# Patient Record
Sex: Male | Born: 1970 | Race: Black or African American | Hispanic: No | Smoking: Current every day smoker
Health system: Southern US, Community
[De-identification: ages and names within clinical notes are randomized; demographics above are authoritative.]

## PROBLEM LIST (undated history)

## (undated) DIAGNOSIS — Z789 Other specified health status: Secondary | ICD-10-CM

## (undated) DIAGNOSIS — I10 Essential (primary) hypertension: Secondary | ICD-10-CM

## (undated) HISTORY — PX: HAND SURGERY: SHX662

---

## 2005-06-16 ENCOUNTER — Emergency Department (HOSPITAL_COMMUNITY): Admission: EM | Admit: 2005-06-16 | Discharge: 2005-06-16 | Payer: Self-pay | Admitting: Emergency Medicine

## 2005-06-20 ENCOUNTER — Emergency Department (HOSPITAL_COMMUNITY): Admission: EM | Admit: 2005-06-20 | Discharge: 2005-06-20 | Payer: Self-pay | Admitting: Family Medicine

## 2008-09-13 ENCOUNTER — Emergency Department (HOSPITAL_COMMUNITY): Admission: EM | Admit: 2008-09-13 | Discharge: 2008-09-13 | Payer: Self-pay | Admitting: Emergency Medicine

## 2008-10-05 ENCOUNTER — Emergency Department (HOSPITAL_COMMUNITY): Admission: EM | Admit: 2008-10-05 | Discharge: 2008-10-05 | Payer: Self-pay | Admitting: Emergency Medicine

## 2008-12-24 ENCOUNTER — Emergency Department (HOSPITAL_COMMUNITY): Admission: EM | Admit: 2008-12-24 | Discharge: 2008-12-24 | Payer: Self-pay | Admitting: Emergency Medicine

## 2009-09-05 ENCOUNTER — Encounter: Admission: RE | Admit: 2009-09-05 | Discharge: 2009-09-05 | Payer: Self-pay | Admitting: *Deleted

## 2010-02-14 ENCOUNTER — Emergency Department (HOSPITAL_COMMUNITY): Admission: EM | Admit: 2010-02-14 | Discharge: 2010-02-14 | Payer: Self-pay | Admitting: Emergency Medicine

## 2010-06-28 ENCOUNTER — Emergency Department (HOSPITAL_COMMUNITY): Admission: EM | Admit: 2010-06-28 | Discharge: 2010-06-28 | Payer: Self-pay | Admitting: Emergency Medicine

## 2011-01-11 ENCOUNTER — Emergency Department (HOSPITAL_COMMUNITY)
Admission: EM | Admit: 2011-01-11 | Discharge: 2011-01-11 | Payer: Self-pay | Source: Home / Self Care | Admitting: Family Medicine

## 2011-01-12 ENCOUNTER — Emergency Department (HOSPITAL_COMMUNITY)
Admission: EM | Admit: 2011-01-12 | Discharge: 2011-01-12 | Payer: Self-pay | Source: Home / Self Care | Admitting: Family Medicine

## 2011-01-13 LAB — DIFFERENTIAL
Basophils Absolute: 0 10*3/uL (ref 0.0–0.1)
Basophils Relative: 0 % (ref 0–1)
Eosinophils Absolute: 0.3 10*3/uL (ref 0.0–0.7)
Eosinophils Relative: 3 % (ref 0–5)
Lymphocytes Relative: 27 % (ref 12–46)
Lymphs Abs: 2.6 10*3/uL (ref 0.7–4.0)
Monocytes Absolute: 0.6 10*3/uL (ref 0.1–1.0)
Monocytes Relative: 6 % (ref 3–12)
Neutro Abs: 6.3 10*3/uL (ref 1.7–7.7)
Neutrophils Relative %: 64 % (ref 43–77)

## 2011-01-13 LAB — STREP A DNA PROBE: Group A Strep Probe: NEGATIVE

## 2011-01-13 LAB — CBC
HCT: 46.6 % (ref 39.0–52.0)
Hemoglobin: 14.7 g/dL (ref 13.0–17.0)
MCH: 27.4 pg (ref 26.0–34.0)
MCHC: 31.5 g/dL (ref 30.0–36.0)
MCV: 86.8 fL (ref 78.0–100.0)
Platelets: 219 10*3/uL (ref 150–400)
RBC: 5.37 MIL/uL (ref 4.22–5.81)
RDW: 13.7 % (ref 11.5–15.5)
WBC: 9.8 10*3/uL (ref 4.0–10.5)

## 2011-01-13 LAB — POCT INFECTIOUS MONO SCREEN: Mono Screen: NEGATIVE

## 2011-01-13 LAB — HIV ANTIBODY (ROUTINE TESTING W REFLEX): HIV: NONREACTIVE

## 2011-01-13 LAB — POCT RAPID STREP A (OFFICE): Streptococcus, Group A Screen (Direct): NEGATIVE

## 2011-03-07 ENCOUNTER — Emergency Department (HOSPITAL_COMMUNITY)
Admission: EM | Admit: 2011-03-07 | Discharge: 2011-03-07 | Disposition: A | Discharge status: Home / Self Care | Payer: 59 | Attending: Emergency Medicine | Admitting: Emergency Medicine

## 2011-03-07 DIAGNOSIS — R059 Cough, unspecified: Secondary | ICD-10-CM | POA: Insufficient documentation

## 2011-03-07 DIAGNOSIS — R05 Cough: Secondary | ICD-10-CM | POA: Insufficient documentation

## 2011-03-07 DIAGNOSIS — J329 Chronic sinusitis, unspecified: Secondary | ICD-10-CM | POA: Insufficient documentation

## 2011-03-07 DIAGNOSIS — R51 Headache: Secondary | ICD-10-CM | POA: Insufficient documentation

## 2011-10-22 ENCOUNTER — Inpatient Hospital Stay (INDEPENDENT_AMBULATORY_CARE_PROVIDER_SITE_OTHER)
Admission: RE | Admit: 2011-10-22 | Discharge: 2011-10-22 | Disposition: A | Discharge status: Home / Self Care | Payer: Self-pay | Source: Ambulatory Visit | Attending: Family Medicine | Admitting: Family Medicine

## 2011-10-22 ENCOUNTER — Ambulatory Visit (INDEPENDENT_AMBULATORY_CARE_PROVIDER_SITE_OTHER): Payer: Self-pay

## 2011-10-22 DIAGNOSIS — S61209A Unspecified open wound of unspecified finger without damage to nail, initial encounter: Secondary | ICD-10-CM

## 2012-01-16 ENCOUNTER — Inpatient Hospital Stay (HOSPITAL_COMMUNITY)
Admission: EM | Admit: 2012-01-16 | Discharge: 2012-01-18 | DRG: 639 | Disposition: A | Discharge status: Home / Self Care | Payer: Self-pay | Attending: Internal Medicine | Admitting: Internal Medicine

## 2012-01-16 ENCOUNTER — Emergency Department (INDEPENDENT_AMBULATORY_CARE_PROVIDER_SITE_OTHER)
Admission: EM | Admit: 2012-01-16 | Discharge: 2012-01-16 | Disposition: A | Discharge status: Other Acute Inpatient Hospital | Payer: Self-pay | Source: Home / Self Care

## 2012-01-16 ENCOUNTER — Encounter (HOSPITAL_COMMUNITY): Payer: Self-pay | Admitting: *Deleted

## 2012-01-16 ENCOUNTER — Emergency Department (HOSPITAL_COMMUNITY): Payer: Self-pay

## 2012-01-16 ENCOUNTER — Encounter (HOSPITAL_COMMUNITY): Payer: Self-pay

## 2012-01-16 DIAGNOSIS — E1110 Type 2 diabetes mellitus with ketoacidosis without coma: Secondary | ICD-10-CM | POA: Diagnosis present

## 2012-01-16 DIAGNOSIS — E1310 Other specified diabetes mellitus with ketoacidosis without coma: Principal | ICD-10-CM | POA: Diagnosis present

## 2012-01-16 DIAGNOSIS — E119 Type 2 diabetes mellitus without complications: Secondary | ICD-10-CM | POA: Diagnosis present

## 2012-01-16 DIAGNOSIS — E860 Dehydration: Secondary | ICD-10-CM | POA: Diagnosis present

## 2012-01-16 DIAGNOSIS — I10 Essential (primary) hypertension: Secondary | ICD-10-CM | POA: Diagnosis present

## 2012-01-16 DIAGNOSIS — F17200 Nicotine dependence, unspecified, uncomplicated: Secondary | ICD-10-CM | POA: Diagnosis present

## 2012-01-16 DIAGNOSIS — R0682 Tachypnea, not elsewhere classified: Secondary | ICD-10-CM | POA: Diagnosis present

## 2012-01-16 DIAGNOSIS — E876 Hypokalemia: Secondary | ICD-10-CM | POA: Diagnosis not present

## 2012-01-16 HISTORY — DX: Other specified health status: Z78.9

## 2012-01-16 LAB — POCT URINALYSIS DIP (DEVICE)
Bilirubin Urine: NEGATIVE
Glucose, UA: 500 mg/dL — AB
Ketones, ur: 40 mg/dL — AB
Leukocytes, UA: NEGATIVE
Nitrite: NEGATIVE
Protein, ur: 100 mg/dL — AB
Specific Gravity, Urine: <=1.005 (ref 1.005–1.030)
Urobilinogen, UA: 0.2 mg/dL (ref 0.0–1.0)
pH: 5 (ref 5.0–8.0)

## 2012-01-16 LAB — CBC
HCT: 54.3 % — ABNORMAL HIGH (ref 39.0–52.0)
HCT: 46 % (ref 39.0–52.0)
Hemoglobin: 17.8 g/dL — ABNORMAL HIGH (ref 13.0–17.0)
MCH: 28 pg (ref 26.0–34.0)
MCHC: 32.8 g/dL (ref 30.0–36.0)
MCV: 85.4 fL (ref 78.0–100.0)
Platelets: 205 10*3/uL (ref 150–400)
RBC: 6.36 MIL/uL — ABNORMAL HIGH (ref 4.22–5.81)
RBC: 5.44 MIL/uL (ref 4.22–5.81)
RDW: 14 % (ref 11.5–15.5)
RDW: 14.1 % (ref 11.5–15.5)
WBC: 10.9 10*3/uL — ABNORMAL HIGH (ref 4.0–10.5)
WBC: 10.3 10*3/uL (ref 4.0–10.5)

## 2012-01-16 LAB — POCT I-STAT 3, VENOUS BLOOD GAS (G3P V)
Bicarbonate: 28.1 mEq/L — ABNORMAL HIGH (ref 20.0–24.0)
pH, Ven: 7.343 — ABNORMAL HIGH (ref 7.250–7.300)

## 2012-01-16 LAB — POCT I-STAT, CHEM 8
BUN: 16 mg/dL (ref 6–23)
Calcium, Ion: 1.33 mmol/L — ABNORMAL HIGH (ref 1.12–1.32)
Chloride: 107 mEq/L (ref 96–112)
Creatinine, Ser: 1 mg/dL (ref 0.50–1.35)
Glucose, Bld: 501 mg/dL — ABNORMAL HIGH (ref 70–99)
HCT: 57 % — ABNORMAL HIGH (ref 39.0–52.0)
Hemoglobin: 19.4 g/dL — ABNORMAL HIGH (ref 13.0–17.0)
Potassium: 4.2 mEq/L (ref 3.5–5.1)
Sodium: 144 mEq/L (ref 135–145)
TCO2: 25 mmol/L (ref 0–100)

## 2012-01-16 LAB — BASIC METABOLIC PANEL
BUN: 16 mg/dL (ref 6–23)
BUN: 15 mg/dL (ref 6–23)
CO2: 25 mEq/L (ref 19–32)
CO2: 24 mEq/L (ref 19–32)
Calcium: 11.5 mg/dL — ABNORMAL HIGH (ref 8.4–10.5)
Chloride: 100 mEq/L (ref 96–112)
Chloride: 106 mEq/L (ref 96–112)
Creatinine, Ser: 1.09 mg/dL (ref 0.50–1.35)
GFR calc Af Amer: >90 mL/min (ref >90)
GFR calc non Af Amer: 85 mL/min — ABNORMAL LOW (ref >90)
Glucose, Bld: 498 mg/dL — ABNORMAL HIGH (ref 70–99)
Glucose, Bld: 304 mg/dL — ABNORMAL HIGH (ref 70–99)
Potassium: 4.4 mEq/L (ref 3.5–5.1)
Potassium: 4.4 mEq/L (ref 3.5–5.1)
Sodium: 146 mEq/L — ABNORMAL HIGH (ref 135–145)

## 2012-01-16 LAB — GLUCOSE, CAPILLARY
Glucose-Capillary: >600 mg/dL (ref 70–99)
Glucose-Capillary: 488 mg/dL — ABNORMAL HIGH (ref 70–99)
Glucose-Capillary: 332 mg/dL — ABNORMAL HIGH (ref 70–99)
Glucose-Capillary: 474 mg/dL — ABNORMAL HIGH (ref 70–99)
Glucose-Capillary: 465 mg/dL — ABNORMAL HIGH (ref 70–99)

## 2012-01-16 LAB — URINALYSIS, ROUTINE W REFLEX MICROSCOPIC
Glucose, UA: >1000 mg/dL — AB
Ketones, ur: 40 mg/dL — AB
Protein, ur: 100 mg/dL — AB

## 2012-01-16 LAB — KETONES, QUALITATIVE

## 2012-01-16 LAB — DIFFERENTIAL
Basophils Absolute: 0 10*3/uL (ref 0.0–0.1)
Basophils Relative: 0 % (ref 0–1)
Eosinophils Absolute: 0 10*3/uL (ref 0.0–0.7)
Eosinophils Relative: 0 % (ref 0–5)
Lymphocytes Relative: 17 % (ref 12–46)
Lymphs Abs: 1.8 10*3/uL (ref 0.7–4.0)
Monocytes Absolute: 0.7 10*3/uL (ref 0.1–1.0)
Monocytes Relative: 7 % (ref 3–12)
Neutro Abs: 8.3 10*3/uL — ABNORMAL HIGH (ref 1.7–7.7)
Neutrophils Relative %: 76 % (ref 43–77)

## 2012-01-16 LAB — POCT I-STAT 3, ART BLOOD GAS (G3+)
O2 Saturation: 96 %
pCO2 arterial: 41.9 mmHg (ref 35.0–45.0)
pO2, Arterial: 89 mmHg (ref 80.0–100.0)

## 2012-01-16 LAB — URINE MICROSCOPIC-ADD ON

## 2012-01-16 MED ORDER — SODIUM CHLORIDE 0.9 % IV SOLN
INTRAVENOUS | Status: AC
  Administered 2012-01-16: 19:00:00 via INTRAVENOUS
  Filled 2012-01-16 (×2): qty 1

## 2012-01-16 MED ORDER — ENOXAPARIN SODIUM 40 MG/0.4ML ~~LOC~~ SOLN: 40.0000 mg | SUBCUTANEOUS | Status: DC

## 2012-01-16 MED ORDER — SODIUM CHLORIDE 0.9 % IV SOLN
INTRAVENOUS | Status: DC
  Administered 2012-01-16: 18:00:00 via INTRAVENOUS

## 2012-01-16 MED ORDER — SODIUM CHLORIDE 0.9 % IV SOLN: INTRAVENOUS | Status: DC

## 2012-01-16 MED ORDER — INSULIN ASPART 100 UNIT/ML ~~LOC~~ SOLN
5.0000 [IU] | Freq: Once | SUBCUTANEOUS | Status: AC
  Administered 2012-01-16: 5 [IU] via INTRAVENOUS
  Filled 2012-01-16: qty 1

## 2012-01-16 MED ORDER — DEXTROSE-NACL 5-0.45 % IV SOLN
INTRAVENOUS | Status: DC
  Administered 2012-01-16: 23:00:00 via INTRAVENOUS

## 2012-01-16 MED ORDER — POTASSIUM CHLORIDE 10 MEQ/100ML IV SOLN
10.0000 meq | INTRAVENOUS | Status: AC
  Administered 2012-01-16 (×2): 10 meq via INTRAVENOUS
  Filled 2012-01-16 (×2): qty 100

## 2012-01-16 MED ORDER — SODIUM CHLORIDE 0.9 % IV SOLN
INTRAVENOUS | Status: AC
  Administered 2012-01-16: 23:00:00 via INTRAVENOUS

## 2012-01-16 MED ORDER — SODIUM CHLORIDE 0.9 % IV SOLN
INTRAVENOUS | Status: DC
  Filled 2012-01-16 (×2): qty 1

## 2012-01-16 MED ORDER — DEXTROSE 50 % IV SOLN: 25.0000 mL | INTRAVENOUS | Status: DC | PRN

## 2012-01-16 MED ORDER — DEXTROSE 50 % IV SOLN
25.0000 mL | INTRAVENOUS | Status: DC | PRN
  Administered 2012-01-17: 13 mL via INTRAVENOUS
  Filled 2012-01-16: qty 50

## 2012-01-16 MED ORDER — DEXTROSE-NACL 5-0.45 % IV SOLN: INTRAVENOUS | Status: DC

## 2012-01-16 MED ORDER — SODIUM CHLORIDE 0.9 % IV BOLUS (SEPSIS)
1000.0000 mL | Freq: Once | INTRAVENOUS | Status: AC
  Administered 2012-01-16: 1000 mL via INTRAVENOUS

## 2012-01-16 MED ORDER — ACETAMINOPHEN 325 MG PO TABS: 650.0000 mg | ORAL_TABLET | Freq: Four times a day (QID) | ORAL | Status: DC | PRN

## 2012-01-16 MED ORDER — INSULIN REGULAR HUMAN 100 UNIT/ML IJ SOLN: 5.0000 [IU] | Freq: Once | INTRAMUSCULAR | Status: DC

## 2012-01-16 MED ORDER — ONDANSETRON HCL 4 MG/2ML IJ SOLN
4.0000 mg | Freq: Once | INTRAMUSCULAR | Status: AC
  Administered 2012-01-16: 4 mg via INTRAVENOUS
  Filled 2012-01-16: qty 2

## 2012-01-16 MED ORDER — ENOXAPARIN SODIUM 40 MG/0.4ML ~~LOC~~ SOLN
40.0000 mg | SUBCUTANEOUS | Status: DC
  Administered 2012-01-16 – 2012-01-17 (×2): 40 mg via SUBCUTANEOUS
  Filled 2012-01-16 (×3): qty 0.4

## 2012-01-16 MED ORDER — INSULIN REGULAR BOLUS VIA INFUSION
0.0000 [IU] | Freq: Three times a day (TID) | INTRAVENOUS | Status: DC
  Filled 2012-01-16 (×4): qty 10

## 2012-01-16 NOTE — ED Notes (Signed)
accucheck 382 mg/dl

## 2012-01-16 NOTE — ED Notes (Signed)
Parish.Fredrickson

## 2012-01-16 NOTE — ED Notes (Signed)
Pt c/o extreme dry mouth, has lost 40lbs in 2 months, fatique for 2 months.

## 2012-01-16 NOTE — H&P (Signed)
PCP:  Sheila Oats, MD, MD   DOA:  01/16/2012  2:00 PM  Chief Complaint:  Polyuria , polydipsia, unintentional weight loss and dryness of mouth for 2 months  HPI:  41 year old African American male with no known past medical history presented to the urgent care with polyuria, polydipsia, nocturia, dry mouth, subjective weight loss of about 40 pounds and  feeling fatigued for last 2 months. He was noted to have a blood glucose of about 600 and sent to [facility]. Denies any history for diabetes mellitus. He denies any nausea or vomiting, fever , denies any abdominal pain headache or blurring of vision, however did feel cramps in his forearms for last few weeks.  In the ED he was again noted to have FSG greater than 500, with an anion gap of 21, positive urinary and serum ketones. He was given 1 L normal saline bolus and 5 units of IV regular insulin and planned for admit to hospitalist service.   Allergies: Allergies  Allergen Reactions  . Penicillins Nausea And Vomiting  . Shellfish Allergy     Prior to Admission medications   Not on File    History reviewed. No pertinent past medical history.  History reviewed. No pertinent past surgical history.     Social History:  reports that he has been smoking a pack per day for 15 years. He reports that he drinks alcohol occasionally . He reports that he does not use illicit drugs.  Family History  Problem Relation Age of Onset  . Diabetes Mother and father     Review of Systems:  Constitutional: appetite change and fatigue with weight loss of about 40 lbs. Denies fever, chills, diaphoresis,  HEENT: c/o dry mouth , Denies photophobia, eye pain, redness, hearing loss, ear pain, congestion, sore throat, rhinorrhea, sneezing, mouth sores, trouble swallowing, neck pain, neck stiffness and tinnitus.   Respiratory: Denies SOB, DOE, cough, chest tightness,  and wheezing.   Cardiovascular: Denies chest pain, palpitations and leg  swelling.  Gastrointestinal: Denies nausea, vomiting, abdominal pain, diarrhea, constipation, blood in stool and abdominal distention.  Genitourinary: Denies dysuria, urgency, frequency, hematuria, flank pain and difficulty urinating.  Musculoskeletal: Denies myalgias, back pain, joint swelling, arthralgias and gait problem.  Skin: Denies pallor, rash and wound.  Neurological: Denies dizziness, seizures, syncope, weakness, light-headedness, numbness and headaches.  Hematological: Denies adenopathy. Easy bruising, personal or family bleeding history  Psychiatric/Behavioral: Denies suicidal ideation, mood changes, confusion, nervousness, sleep disturbance and agitation   Physical Exam:  Filed Vitals:   01/16/12 1401 01/16/12 1607 01/16/12 1739  BP: 152/101 128/86 131/74  Pulse: 96  72  Temp: 98.2 F (36.8 C)    TempSrc: Oral    Resp: 20  16  SpO2: 96%  100%    Constitutional: Vital signs reviewed.  Patient is a well-developed and well-nourished in no acute distress and cooperative with exam. Alert and oriented x3.  HEENT: no pallor, no icterus, dry oral mucosa Cardiovascular: RRR, S1 normal, S2 normal, no MRG, pulses symmetric and intact bilaterally Pulmonary/Chest: CTAB, no wheezes, rales, or rhonchi Abdominal: Soft. Non-tender, non-distended, bowel sounds are normal, no masses, organomegaly, or guarding present.  GU: no CVA tenderness Musculoskeletal: No joint deformities, erythema, or stiffness, ROM full and no nontender Ext: no edema and no cyanosis, pulses palpable bilaterally (DP and PT) Hematology: no cervical, inginal, or axillary adenopathy.  Neurological: A&O x3, Strenght is normal and symmetric bilaterally, cranial nerve II-XII are grossly intact, no focal motor deficit, sensory intact  to light touch bilaterally.  Skin: Warm, dry and intact. No rash, cyanosis, or clubbing.  Psychiatric: Normal mood and affect. speech and behavior is normal. Judgment and thought content  normal. Cognition and memory are normal.   Labs on Admission:  Results for orders placed during the hospital encounter of 01/16/12 (from the past 48 hour(s))  KETONES, QUALITATIVE     Status: Abnormal   Collection Time   01/16/12  3:50 PM      Component Value Range Comment   Acetone, Bld SMALL (*) NEGATIVE    CBC     Status: Abnormal   Collection Time   01/16/12  3:50 PM      Component Value Range Comment   WBC 10.9 (*) 4.0 - 10.5 (K/uL)    RBC 6.36 (*) 4.22 - 5.81 (MIL/uL)    Hemoglobin 17.8 (*) 13.0 - 17.0 (g/dL)    HCT 78.2 (*) 95.6 - 52.0 (%)    MCV 85.4  78.0 - 100.0 (fL)    MCH 28.0  26.0 - 34.0 (pg)    MCHC 32.8  30.0 - 36.0 (g/dL)    RDW 21.3  08.6 - 57.8 (%)    Platelets 205  150 - 400 (K/uL)   DIFFERENTIAL     Status: Abnormal   Collection Time   01/16/12  3:50 PM      Component Value Range Comment   Neutrophils Relative 76  43 - 77 (%)    Neutro Abs 8.3 (*) 1.7 - 7.7 (K/uL)    Lymphocytes Relative 17  12 - 46 (%)    Lymphs Abs 1.8  0.7 - 4.0 (K/uL)    Monocytes Relative 7  3 - 12 (%)    Monocytes Absolute 0.7  0.1 - 1.0 (K/uL)    Eosinophils Relative 0  0 - 5 (%)    Eosinophils Absolute 0.0  0.0 - 0.7 (K/uL)    Basophils Relative 0  0 - 1 (%)    Basophils Absolute 0.0  0.0 - 0.1 (K/uL)   BASIC METABOLIC PANEL     Status: Abnormal   Collection Time   01/16/12  3:50 PM      Component Value Range Comment   Sodium 146 (*) 135 - 145 (mEq/L)    Potassium 4.4  3.5 - 5.1 (mEq/L)    Chloride 100  96 - 112 (mEq/L)    CO2 25  19 - 32 (mEq/L)    Glucose, Bld 498 (*) 70 - 99 (mg/dL)    BUN 16  6 - 23 (mg/dL)    Creatinine, Ser 4.69  0.50 - 1.35 (mg/dL)    Calcium 62.9 (*) 8.4 - 10.5 (mg/dL)    GFR calc non Af Amer 85 (*) >90 (mL/min)    GFR calc Af Amer >90  >90 (mL/min)   GLUCOSE, CAPILLARY     Status: Abnormal   Collection Time   01/16/12  3:50 PM      Component Value Range Comment   Glucose-Capillary 488 (*) 70 - 99 (mg/dL)    Comment 1 Documented in Chart       URINALYSIS, ROUTINE W REFLEX MICROSCOPIC     Status: Abnormal   Collection Time   01/16/12  4:16 PM      Component Value Range Comment   Color, Urine YELLOW  YELLOW     APPearance CLEAR  CLEAR     Specific Gravity, Urine 1.044 (*) 1.005 - 1.030     pH 5.0  5.0 -  8.0     Glucose, UA >1000 (*) NEGATIVE (mg/dL)    Hgb urine dipstick SMALL (*) NEGATIVE     Bilirubin Urine NEGATIVE  NEGATIVE     Ketones, ur 40 (*) NEGATIVE (mg/dL)    Protein, ur 960 (*) NEGATIVE (mg/dL)    Urobilinogen, UA 0.2  0.0 - 1.0 (mg/dL)    Nitrite NEGATIVE  NEGATIVE     Leukocytes, UA NEGATIVE  NEGATIVE    URINE MICROSCOPIC-ADD ON     Status: Normal   Collection Time   01/16/12  4:16 PM      Component Value Range Comment   WBC, UA 0-2  <3 (WBC/hpf)    RBC / HPF 0-2  <3 (RBC/hpf)   POCT I-STAT 3, BLOOD GAS (G3P V)     Status: Abnormal   Collection Time   01/16/12  4:55 PM      Component Value Range Comment   pH, Ven 7.343 (*) 7.250 - 7.300     pCO2, Ven 51.9 (*) 45.0 - 50.0 (mmHg)    pO2, Ven 21.0 (*) 30.0 - 45.0 (mmHg)    Bicarbonate 28.1 (*) 20.0 - 24.0 (mEq/L)    TCO2 30  0 - 100 (mmol/L)    O2 Saturation 32.0      Acid-Base Excess 1.0  0.0 - 2.0 (mmol/L)    Sample type VENOUS      Comment NOTIFIED PHYSICIAN     POCT I-STAT 3, BLOOD GAS (G3+)     Status: Abnormal   Collection Time   01/16/12  5:00 PM      Component Value Range Comment   pH, Arterial 7.342 (*) 7.350 - 7.450     pCO2 arterial 41.9  35.0 - 45.0 (mmHg)    pO2, Arterial 89.0  80.0 - 100.0 (mmHg)    Bicarbonate 22.7  20.0 - 24.0 (mEq/L)    TCO2 24  0 - 100 (mmol/L)    O2 Saturation 96.0      Acid-base deficit 3.0 (*) 0.0 - 2.0 (mmol/L)    Patient temperature 98.6 F      Collection site RADIAL, ALLEN'S TEST ACCEPTABLE      Drawn by Operator      Sample type ARTERIAL     GLUCOSE, CAPILLARY     Status: Abnormal   Collection Time   01/16/12  5:35 PM      Component Value Range Comment   Glucose-Capillary 332 (*) 70 - 99 (mg/dL)      Radiological Exams on Admission: CXR wnl  Assessment 41 year old [place] American male with no known past medical history presented to the urgent care with polyuria, polydipsia, nocturia, dry mouth, subjective weight loss of about 40 pounds and  feeling fatigued for last 2 months. He was noted to have a blood glucose of about 600 and sent to [facility].  Plan  #1 new onset diabetes mellitus with ketoacidosis Patient was given on 1 L normal saline bolus in the ED with 5 units regular insulin Admit  to  step down and start glucose stabilizer protocol with aggressive IV NS @250  CC/HR for hydration and insulin drip with q2h BMP monitoring of anion gap Start D5 half NS once FSG less than 50 and start subcutaneous Lantus insulin once anion gap closed. Check beta OH butarate, lipid panel and hemoglobin A1c monitor and Keep potassium  above 4  Consult diabetic educator Patient counseled on dietary and lifestyle modifications given his new onset DM . Also counseled on smoking  cessation.  Diet: diabetic  DVT prophylaxis   Code status full code Time Spent on Admission: 45 minutes  Deanthony Maull 01/16/2012, 7:22 PM

## 2012-01-16 NOTE — ED Provider Notes (Signed)
Medical screening examination/treatment/procedure(s) were performed by non-physician practitioner and as supervising physician I was immediately available for consultation/collaboration.   Barkley Bruns MD.    Barkley Bruns, MD 01/16/12 [phone]

## 2012-01-16 NOTE — ED Notes (Signed)
Abnormal labs given to MD 

## 2012-01-16 NOTE — ED Notes (Signed)
Pharmacy aware of insulin drip order - will tube it to Pod B when mixed

## 2012-01-16 NOTE — ED Provider Notes (Signed)
History     CSN: [id number]  Arrival date & time 01/16/12  1202   None     Chief Complaint  Patient presents with  . Weight Loss    weight loss, dry mouth    (Consider location/radiation/quality/duration/timing/severity/associated sxs/prior treatment) HPI Comments: Patient presents today with complaints of fatigue, increased thirs,t frequent urination and weight loss for the last 6-8 weeks. He states he's lost 40 pounds in the last 2 months. He has a strong family history of diabetes. He has not had a physician for years and has not had his blood sugar checked previously. He denies headaches, dizziness, chest discomfort, or dyspnea. He has tried everything to quench his thirst - has been sucking on hard candies, drinking juice, water, milk and anything he can.    History reviewed. No pertinent past medical history.  History reviewed. No pertinent past surgical history.  Family History  Problem Relation Age of Onset  . Diabetes Mother     History  Substance Use Topics  . Smoking status: Current Everyday Smoker  . Smokeless tobacco: Not on file  . Alcohol Use: Yes      Review of Systems  Constitutional: Positive for fatigue and unexpected weight change. Negative for fever and chills.  Respiratory: Negative for cough and shortness of breath.   Cardiovascular: Negative for chest pain and palpitations.  Gastrointestinal: Negative for nausea, vomiting and abdominal pain.  Neurological: Negative for dizziness and headaches.    Allergies  Penicillins and Shellfish allergy  Home Medications  No current outpatient prescriptions on file.  BP 135/84  Pulse 86  Temp(Src) 98.7 F (37.1 C) (Oral)  Resp 20  SpO2 98%  Physical Exam  Nursing note and vitals reviewed. Constitutional: He appears well-developed and well-nourished. No distress.  HENT:  Head: Normocephalic and atraumatic.  Right Ear: Tympanic membrane, external ear and ear canal normal.  Left Ear: Tympanic  membrane, external ear and ear canal normal.  Nose: Nose normal.  Mouth/Throat: Uvula is midline, oropharynx is clear and moist and mucous membranes are normal. No oropharyngeal exudate, posterior oropharyngeal edema or posterior oropharyngeal erythema.  Neck: Neck supple.  Cardiovascular: Normal rate, regular rhythm and normal heart sounds.   Pulmonary/Chest: Effort normal and breath sounds normal. No respiratory distress.  Abdominal: Soft. Bowel sounds are normal. He exhibits no distension and no mass. There is no tenderness.  Lymphadenopathy:    He has no cervical adenopathy.  Neurological: He is alert.  Skin: Skin is warm and dry.  Psychiatric: He has a normal mood and affect.    ED Course  Procedures (including critical care time)  Labs Reviewed  GLUCOSE, CAPILLARY - Abnormal; Notable for the following:    Glucose-Capillary >600 (*)    All other components within normal limits  POCT I-STAT, CHEM 8 - Abnormal; Notable for the following:    Glucose, Bld 501 (*)    Calcium, Ion 1.33 (*)    Hemoglobin 19.4 (*)    HCT 57.0 (*)    All other components within normal limits  POCT URINALYSIS DIP (DEVICE) - Abnormal; Notable for the following:    Glucose, UA 500 (*)    Ketones, ur 40 (*)    Hgb urine dipstick SMALL (*)    Protein, ur 100 (*)    All other components within normal limits  POCT CBG MONITORING  I-STAT, CHEM 8  POCT URINALYSIS DIPSTICK   No results found.   1. Newly diagnosed diabetes  MDM   Pt transferred to [facility] via shuttle. Discussed with Dr Artis Flock.        Melody Comas, [place] 01/16/12 1350

## 2012-01-16 NOTE — ED Notes (Signed)
Pt sent [facility] from [facility] for further eval of new onset diabetes, cbg was 501. Having recent fatigue, dry mouth and weight loss.

## 2012-01-16 NOTE — ED Notes (Signed)
accucheck 402 mg/dl

## 2012-01-17 LAB — BASIC METABOLIC PANEL
BUN: 13 mg/dL (ref 6–23)
Calcium: 9.5 mg/dL (ref 8.4–10.5)
Chloride: 111 mEq/L (ref 96–112)
Chloride: 108 mEq/L (ref 96–112)
Creatinine, Ser: 0.83 mg/dL (ref 0.50–1.35)
GFR calc Af Amer: >90 mL/min (ref >90)
GFR calc Af Amer: >90 mL/min (ref >90)
GFR calc Af Amer: >90 mL/min (ref >90)
GFR calc non Af Amer: >90 mL/min (ref >90)
GFR calc non Af Amer: >90 mL/min (ref >90)
Potassium: 3.2 mEq/L — ABNORMAL LOW (ref 3.5–5.1)
Potassium: 3.7 mEq/L (ref 3.5–5.1)
Sodium: 147 mEq/L — ABNORMAL HIGH (ref 135–145)
Sodium: 145 mEq/L (ref 135–145)

## 2012-01-17 LAB — LIPID PANEL
Cholesterol: 325 mg/dL — ABNORMAL HIGH (ref 0–200)
HDL: 33 mg/dL — ABNORMAL LOW (ref >39)
Total CHOL/HDL Ratio: 9.8 RATIO
Triglycerides: 750 mg/dL — ABNORMAL HIGH (ref <150)

## 2012-01-17 LAB — GLUCOSE, CAPILLARY
Glucose-Capillary: 210 mg/dL — ABNORMAL HIGH (ref 70–99)
Glucose-Capillary: 113 mg/dL — ABNORMAL HIGH (ref 70–99)
Glucose-Capillary: 118 mg/dL — ABNORMAL HIGH (ref 70–99)
Glucose-Capillary: 68 mg/dL — ABNORMAL LOW (ref 70–99)
Glucose-Capillary: 146 mg/dL — ABNORMAL HIGH (ref 70–99)
Glucose-Capillary: 177 mg/dL — ABNORMAL HIGH (ref 70–99)
Glucose-Capillary: 409 mg/dL — ABNORMAL HIGH (ref 70–99)

## 2012-01-17 LAB — CBC
Hemoglobin: 14.8 g/dL (ref 13.0–17.0)
Platelets: 192 10*3/uL (ref 150–400)
RBC: 5.39 MIL/uL (ref 4.22–5.81)
WBC: 10.5 10*3/uL (ref 4.0–10.5)

## 2012-01-17 MED ORDER — ONDANSETRON HCL 4 MG/2ML IJ SOLN
INTRAMUSCULAR | Status: AC
  Filled 2012-01-17: qty 2

## 2012-01-17 MED ORDER — INSULIN GLARGINE 100 UNIT/ML ~~LOC~~ SOLN: 15.0000 [IU] | Freq: Every day | SUBCUTANEOUS | Status: DC

## 2012-01-17 MED ORDER — POTASSIUM CHLORIDE 10 MEQ/100ML IV SOLN
INTRAVENOUS | Status: AC
  Administered 2012-01-17: 10 meq via INTRAVENOUS
  Filled 2012-01-17: qty 100

## 2012-01-17 MED ORDER — INSULIN ASPART 100 UNIT/ML ~~LOC~~ SOLN
0.0000 [IU] | SUBCUTANEOUS | Status: DC
  Administered 2012-01-17: 5 [IU] via SUBCUTANEOUS
  Administered 2012-01-17: 8 [IU] via SUBCUTANEOUS
  Administered 2012-01-17: 15 [IU] via SUBCUTANEOUS
  Administered 2012-01-17: 5 [IU] via SUBCUTANEOUS
  Administered 2012-01-17: 15 [IU] via SUBCUTANEOUS
  Administered 2012-01-18: 11 [IU] via SUBCUTANEOUS
  Administered 2012-01-18: 8 [IU] via SUBCUTANEOUS
  Administered 2012-01-18: 3 [IU] via SUBCUTANEOUS
  Administered 2012-01-18: 15 [IU] via SUBCUTANEOUS
  Administered 2012-01-18: 11 [IU] via SUBCUTANEOUS
  Filled 2012-01-17: qty 3

## 2012-01-17 MED ORDER — ONDANSETRON HCL 4 MG/2ML IJ SOLN
4.0000 mg | Freq: Four times a day (QID) | INTRAMUSCULAR | Status: DC | PRN
  Administered 2012-01-17: 4 mg via INTRAVENOUS

## 2012-01-17 MED ORDER — INSULIN GLARGINE 100 UNIT/ML ~~LOC~~ SOLN
25.0000 [IU] | Freq: Every day | SUBCUTANEOUS | Status: DC
  Administered 2012-01-18: 25 [IU] via SUBCUTANEOUS
  Filled 2012-01-17: qty 3

## 2012-01-17 MED ORDER — INSULIN GLARGINE 100 UNIT/ML ~~LOC~~ SOLN
10.0000 [IU] | Freq: Every day | SUBCUTANEOUS | Status: DC
Start: 2012-01-17 — End: 2012-01-17
  Administered 2012-01-17: 10 [IU] via SUBCUTANEOUS
  Filled 2012-01-17: qty 3

## 2012-01-17 MED ORDER — POTASSIUM CHLORIDE 10 MEQ/100ML IV SOLN
10.0000 meq | INTRAVENOUS | Status: AC
  Administered 2012-01-17 (×2): 10 meq via INTRAVENOUS
  Filled 2012-01-17: qty 100

## 2012-01-17 NOTE — Progress Notes (Signed)
At 1630 DM coordinator was notified about this patient having cbg of 409 because wasn't able to get in touch with the rounding physician. She advised me to go ahead and give the correction dose and notified the MD about it. i did as she said.

## 2012-01-17 NOTE — Progress Notes (Signed)
CRITICAL VALUE ALERT  Critical value received:  cbg-68, 13cc D50 given per glucose stabilizer, repeat cbg-118  Date of notification:  01/17/12  Time of notification:  0345  Critical value read back:yes  Nurse who received alert:  Marcellina Millin, RN  MD notified (1st page):  Claiborne Billings, [place]  Time of first page:  0345  MD notified (2nd page):n/a  Time of second page:n/a  Responding MD:  Claiborne Billings, [place]  Time MD responded:  [phone]

## 2012-01-17 NOTE — Progress Notes (Signed)
The patient is ready to transfer to 3000. Vital signs are stable and patient's belongings are with him. Family was updated. Report given to RN on 3000

## 2012-01-17 NOTE — Progress Notes (Signed)
Clinical social worker received referral for other psychosocial needs, however per discussion with pt medical team and m.d. Consult was for diabetic education. CSW reviewed patient chart, and no further csw needs, signing off. Please re consult if needed.   Catha Gosselin, Theresia Majors  [phone] .01/17/2012 16:21pm

## 2012-01-17 NOTE — Progress Notes (Signed)
At 1700 Md was notified about patient's CBG been greater than 400. The action was to increase the lantus  And will be started today instead of tomorrow. Will continue to monitor patient's cbg .

## 2012-01-17 NOTE — Progress Notes (Signed)
Utilization Review Completed.Erik Chung T1/21/2013   

## 2012-01-17 NOTE — Progress Notes (Signed)
Inpatient Diabetes Program Recommendations  AACE/ADA: New Consensus Statement on Inpatient Glycemic Control (2009)  Target Ranges:  Prepandial:   less than 140 mg/dL      Peak postprandial:   less than 180 mg/dL (1-2 hours)      Critically ill patients:  140 - 180 mg/dL   Reason for Visit: RN requesting assistance  Inpatient Diabetes Program Recommendations Insulin - Basal: Pt needs additional Lantus following discontinuation of IV insulin drip.  RN called regarding cbg greater than 400 mg/dL at 1610 today.  Advised her to call MD and get orders for correction dose now and Lantus dose to be given this evening.  Note: Checked latest B-met.  Although cbg is high greater than 400 mg/dL, pt is not presently acidotic.  Will follow-up with patient and MD. Thank you, Lenor Coffin, RN, CNS, Diabetes Coordinator ([phone])     01-17-12 Diabetes Coordinator:

## 2012-01-17 NOTE — Progress Notes (Signed)
CRITICAL VALUE ALERT  Critical value received:  HgbA1C-16.5  Date of notification:  01/17/12  Time of notification:  0050  Critical value read back:yes  Nurse who received alert:  Marcellina Millin, RN  MD notified (1st page):  Claiborne Billings, [place]  Time of first page:  0150  MD notified (2nd page):n/a  Time of second page:n/a  Responding MD:  Claiborne Billings, [place]  Time MD responded:  [phone]

## 2012-01-17 NOTE — ED Provider Notes (Signed)
History     CSN: [id number]  Arrival date & time 01/16/12  1359   First MD Initiated Contact with Patient 01/16/12 1459      Chief Complaint  Patient presents with  . Hyperglycemia    (Consider location/radiation/quality/duration/timing/severity/associated sxs/prior treatment) HPI Patient is a 41 yo male who presents from urgent care for evaluation of hyperglycemia.  aAtient has had 6-8 weeks of increasing fatigue, dry mouth, polyuria, and polydipsia.  He has family history but no personal history of DM.  He does not see a doctor regularly and has no PCP.  He has had a 40 lb weight loss over 2 months with minimal efforts at this.  Patient denies any recent fevers, coughs, or abdominal pain.  Ketonuria was noted on UCC UA.  Patient has no other infectious symptoms including fevers, chills, or night sweats.  He denies any pain but is currently nauseas and very tired.  He is minimally tachycardic but afebrile and mildly hypertensive on presentation.  There are no other associated or modifying factors. Past Medical History  Diagnosis Date  . No pertinent past medical history     History reviewed. No pertinent past surgical history.  Family History  Problem Relation Age of Onset  . Diabetes Mother     History  Substance Use Topics  . Smoking status: Current Everyday Smoker -- 1.0 packs/day    Types: Cigarettes  . Smokeless tobacco: Not on file  . Alcohol Use: 0.6 oz/week    1 Glasses of wine per week      Review of Systems  Constitutional: Positive for appetite change, fatigue and unexpected weight change.  HENT:       Dry mouth  Eyes: Negative.   Respiratory: Negative.   Cardiovascular: Negative.   Gastrointestinal: Positive for nausea and vomiting. Negative for abdominal pain.  Genitourinary: Positive for frequency.  Musculoskeletal: Negative.   Skin: Negative.   Neurological: Negative.   Hematological: Negative.   Psychiatric/Behavioral: Negative.   All other  systems reviewed and are negative.    Allergies  Penicillins and Shellfish allergy  Home Medications  No current outpatient prescriptions on file.  BP 138/64  Pulse 60  Temp(Src) 97.4 F (36.3 C) (Oral)  Resp 10  Ht 5\' 11"  (1.803 m)  Wt 220 lb 0.3 oz (99.8 kg)  BMI 30.69 kg/m2  SpO2 92%  Physical Exam  Nursing note and vitals reviewed. Constitutional: He is oriented to person, place, and time. He appears well-developed and well-nourished. No distress.       Tired and uncomfortable-appearing  HENT:  Head: Normocephalic and atraumatic.  Eyes: Conjunctivae and EOM are normal. Pupils are equal, round, and reactive to light.  Neck: Normal range of motion.  Cardiovascular: Regular rhythm, normal heart sounds and intact distal pulses.  Tachycardia present.  Exam reveals no gallop and no friction rub.   No murmur heard. Pulmonary/Chest: Breath sounds normal. Tachypnea noted. No respiratory distress. He has no wheezes. He has no rales.  Abdominal: Soft. Bowel sounds are normal. He exhibits no distension. There is no tenderness. There is no rebound and no guarding.  Musculoskeletal: Normal range of motion. He exhibits no edema and no tenderness.  Neurological: He is alert and oriented to person, place, and time. No cranial nerve deficit. He exhibits normal muscle tone. Coordination normal.  Skin: Skin is warm and dry. No rash noted.  Psychiatric: He has a normal mood and affect.    ED Course  Procedures (including critical care time)  Labs Reviewed  KETONES, QUALITATIVE - Abnormal; Notable for the following:    Acetone, Bld SMALL (*)    All other components within normal limits  CBC - Abnormal; Notable for the following:    WBC 10.9 (*)    RBC 6.36 (*)    Hemoglobin 17.8 (*)    HCT 54.3 (*)    All other components within normal limits  DIFFERENTIAL - Abnormal; Notable for the following:    Neutro Abs 8.3 (*)    All other components within normal limits  BASIC METABOLIC  PANEL - Abnormal; Notable for the following:    Sodium 146 (*)    Glucose, Bld 498 (*)    Calcium 11.5 (*)    GFR calc non Af Amer 85 (*)    All other components within normal limits  URINALYSIS, ROUTINE W REFLEX MICROSCOPIC - Abnormal; Notable for the following:    Specific Gravity, Urine 1.044 (*)    Glucose, UA >1000 (*)    Hgb urine dipstick SMALL (*)    Ketones, ur 40 (*)    Protein, ur 100 (*)    All other components within normal limits  GLUCOSE, CAPILLARY - Abnormal; Notable for the following:    Glucose-Capillary 488 (*)    All other components within normal limits  POCT I-STAT 3, BLOOD GAS (G3P V) - Abnormal; Notable for the following:    pH, Ven 7.343 (*)    pCO2, Ven 51.9 (*)    pO2, Ven 21.0 (*)    Bicarbonate 28.1 (*)    All other components within normal limits  POCT I-STAT 3, BLOOD GAS (G3+) - Abnormal; Notable for the following:    pH, Arterial 7.342 (*)    Acid-base deficit 3.0 (*)    All other components within normal limits  GLUCOSE, CAPILLARY - Abnormal; Notable for the following:    Glucose-Capillary 332 (*)    All other components within normal limits  HEMOGLOBIN A1C - Abnormal; Notable for the following:    Hemoglobin A1C 16.5 (*)    Mean Plasma Glucose 427 (*)    All other components within normal limits  GLUCOSE, CAPILLARY - Abnormal; Notable for the following:    Glucose-Capillary 474 (*)    All other components within normal limits  GLUCOSE, CAPILLARY - Abnormal; Notable for the following:    Glucose-Capillary 382 (*)    All other components within normal limits  GLUCOSE, CAPILLARY - Abnormal; Notable for the following:    Glucose-Capillary 402 (*)    All other components within normal limits  BASIC METABOLIC PANEL - Abnormal; Notable for the following:    Glucose, Bld 304 (*)    All other components within normal limits  BASIC METABOLIC PANEL - Abnormal; Notable for the following:    Glucose, Bld 182 (*)    All other components within normal  limits  BASIC METABOLIC PANEL - Abnormal; Notable for the following:    Sodium 147 (*)    Potassium 3.2 (*)    Glucose, Bld 103 (*)    All other components within normal limits  BASIC METABOLIC PANEL - Abnormal; Notable for the following:    Glucose, Bld 149 (*)    All other components within normal limits  GLUCOSE, CAPILLARY - Abnormal; Notable for the following:    Glucose-Capillary 465 (*)    All other components within normal limits  LIPID PANEL - Abnormal; Notable for the following:    Cholesterol 325 (*)    Triglycerides 750 (*)  HDL 33 (*)    All other components within normal limits  GLUCOSE, CAPILLARY - Abnormal; Notable for the following:    Glucose-Capillary 275 (*)    All other components within normal limits  GLUCOSE, CAPILLARY - Abnormal; Notable for the following:    Glucose-Capillary 210 (*)    All other components within normal limits  GLUCOSE, CAPILLARY - Abnormal; Notable for the following:    Glucose-Capillary 187 (*)    All other components within normal limits  GLUCOSE, CAPILLARY - Abnormal; Notable for the following:    Glucose-Capillary 158 (*)    All other components within normal limits  URINE MICROSCOPIC-ADD ON  CBC  MRSA PCR SCREENING  CBC  I-STAT 3, BLOOD GAS (G3PV)  POCT CBG MONITORING  POCT CBG MONITORING  POCT CBG MONITORING  BETA-HYDROXYBUTYRIC ACID  POCT CBG MONITORING   Dg Chest 2 View  01/16/2012  *RADIOLOGY REPORT*  Clinical Data: Fatigue  CHEST - 2 VIEW  Comparison: 10/05/2008  Findings: Lungs are clear. No pleural effusion or pneumothorax.  Cardiomediastinal silhouette is within normal limits.  Mild degenerative changes of the visualized thoracolumbar spine.  IMPRESSION: Normal chest radiographs.  Original Report Authenticated By: Charline Bills, M.D.   CRITICAL CARE Performed by: Cyndra Numbers   Total critical care time: 30 minutes  Critical care time was exclusive of separately billable procedures and treating other  patients.  Critical care was necessary to treat or prevent imminent or life-threatening deterioration.  Critical care was time spent personally by me on the following activities: development of treatment plan with patient and/or surrogate as well as nursing, discussions with consultants, evaluation of patient's response to treatment, examination of patient, obtaining history from patient or surrogate, ordering and performing treatments and interventions, ordering and review of laboratory studies, ordering and review of radiographic studies, pulse oximetry and re-evaluation of patient's condition.   1. DKA (diabetic ketoacidoses)       MDM  Patient was evaluated and work-up for possible DKA was initiated.  Urinalysis and CXR showed no infectious cause for symptoms and ketonuria was again noted.  CBC showed minimal leukocytosis of 10.9.  VBG and BMP were ordered.  Initial fingerstick was 488 and NS 1 L IV bolus as well as regular insulin 5 units IV was ordered as patient was insulin naive.  Patient VBG showed pH of 7.34 with a base deficit of 3.  BMP showed a AG of 21.  Glucose stabilizer protocol was initiated.  Patient was admitted as an unassigned patient to the [facility] service to stepdown bed.          Cyndra Numbers, MD 01/17/12 [phone]

## 2012-01-17 NOTE — Progress Notes (Signed)
Patient ID: Erik Chung, male   DOB: 08/03/1971, 41 y.o.   MRN: [id number]  Subjective: No events overnight. Patient denies chest pain, shortness of breath, abdominal pain. Had bowel movement and reports ambulating.  Objective:  Vital signs in last 24 hours:  Filed Vitals:   01/16/12 2112 01/17/12 0000 01/17/12 0400 01/17/12 0800  BP: 154/84 132/82 138/64 136/79  Pulse: 89  60 61  Temp: 97.9 F (36.6 C) 98.2 F (36.8 C) 97.4 F (36.3 C) 98 F (36.7 C)  TempSrc: Oral Oral Oral Oral  Resp: 18  10 16   Height: 5\' 11"  (1.803 m)     Weight: 99.8 kg (220 lb 0.3 oz)     SpO2: 100%  92% 99%    Intake/Output from previous day:   Intake/Output Summary (Last 24 hours) at 01/17/12 0823 Last data filed at 01/17/12 0800  Gross per 24 hour  Intake 1889.7 ml  Output    550 ml  Net 1339.7 ml    Physical Exam: General: Alert, awake, oriented x3, in no acute distress. HEENT: No bruits, no goiter. Moist mucous membranes, no scleral icterus, no conjunctival pallor. Heart: Regular rate and rhythm, S1/S2 +, no murmurs, rubs, gallops. Lungs: Clear to auscultation bilaterally. No wheezing, no rhonchi, no rales.  Abdomen: Soft, nontender, nondistended, positive bowel sounds. Extremities: No clubbing or cyanosis, no pitting edema,  positive pedal pulses. Neuro: Grossly nonfocal.  Lab Results:  Basic Metabolic Panel:    Component Value Date/Time   NA 145 01/17/2012 0357   K 3.7 01/17/2012 0357   CL 108 01/17/2012 0357   CO2 26 01/17/2012 0357   BUN 13 01/17/2012 0357   CREATININE 0.83 01/17/2012 0357   GLUCOSE 149* 01/17/2012 0357   CALCIUM 9.3 01/17/2012 0357   CBC:    Component Value Date/Time   WBC 10.5 01/17/2012 0014   HGB 14.8 01/17/2012 0014   HCT 45.8 01/17/2012 0014   PLT 192 01/17/2012 0014   MCV 85.0 01/17/2012 0014   NEUTROABS 8.3* 01/16/2012 1550   LYMPHSABS 1.8 01/16/2012 1550   MONOABS 0.7 01/16/2012 1550   EOSABS 0.0 01/16/2012 1550   BASOSABS 0.0 01/16/2012 1550      Lab  01/17/12 0014 01/16/12 2207 01/16/12 1550 01/16/12 1307  WBC 10.5 10.3 10.9* --  HGB 14.8 15.1 17.8* 19.4*  HCT 45.8 46.0 54.3* 57.0*  PLT 192 189 205 --  MCV 85.0 84.6 85.4 --  MCH 27.5 27.8 28.0 --  MCHC 32.3 32.8 32.8 --  RDW 14.0 14.1 14.0 --  LYMPHSABS -- -- 1.8 --  MONOABS -- -- 0.7 --  EOSABS -- -- 0.0 --  BASOSABS -- -- 0.0 --  BANDABS -- -- -- --    Lab 01/17/12 0357 01/17/12 0205 01/17/12 0014 01/16/12 2207 01/16/12 1550  NA 145 147* 145 141 146*  K 3.7 3.2* 4.1 4.4 4.4  CL 108 111 109 106 100  CO2 26 26 25 24 25   GLUCOSE 149* 103* 182* 304* 498*  BUN 13 13 14 15 16   CREATININE 0.83 0.78 0.85 0.84 1.09  CALCIUM 9.3 9.2 9.5 9.7 11.5*  MG -- -- -- -- --   No results found for this basename: INR:5,PROTIME:5 in the last 168 hours Cardiac markers: No results found for this basename: CK:3,CKMB:3,TROPONINI:3,MYOGLOBIN:3 in the last 168 hours No components found with this basename: POCBNP:3 Recent Results (from the past 240 hour(s))  MRSA PCR SCREENING     Status: Normal   Collection Time   01/16/12  9:25 PM      Component Value Range Status Comment   MRSA by PCR NEGATIVE  NEGATIVE  Final     Studies/Results: Dg Chest 2 View  01/16/2012  *RADIOLOGY REPORT*  Clinical Data: Fatigue  CHEST - 2 VIEW  Comparison: 10/05/2008  Findings: Lungs are clear. No pleural effusion or pneumothorax.  Cardiomediastinal silhouette is within normal limits.  Mild degenerative changes of the visualized thoracolumbar spine.  IMPRESSION: Normal chest radiographs.  Original Report Authenticated By: Charline Bills, M.D.    Medications: Scheduled Meds:   . enoxaparin  40 mg Subcutaneous Q24H  . insulin aspart  0-15 Units Subcutaneous Q4H  . insulin aspart  5 Units Intravenous Once  . insulin glargine  15 Units Subcutaneous Daily  . insulin (NOVOLIN-R) infusion   Intravenous To Major  . ondansetron  4 mg Intravenous Once  . potassium chloride  10 mEq Intravenous Q1H  . potassium chloride   10 mEq Intravenous Q1 Hr x 2  . sodium chloride  1,000 mL Intravenous Once  . DISCONTD: enoxaparin  40 mg Subcutaneous Q24H  . DISCONTD: insulin glargine  10 Units Subcutaneous Daily  . DISCONTD: insulin regular  5 Units Subcutaneous Once  . DISCONTD: insulin regular  0-10 Units Intravenous TID WC   Continuous Infusions:   . sodium chloride 999 mL/hr at 01/16/12 2233  . DISCONTD: sodium chloride 150 mL/hr at 01/16/12 1819  . DISCONTD: sodium chloride Stopped (01/16/12 2316)  . DISCONTD: dextrose 5 % and 0.45% NaCl    . DISCONTD: dextrose 5 % and 0.45% NaCl 75 mL/hr at 01/16/12 2316  . DISCONTD: insulin (NOVOLIN-R) infusion Stopped (01/17/12 0600)   PRN Meds:.acetaminophen, dextrose, ondansetron, DISCONTD: dextrose  Assessment/Plan:  Principal Problem:  *Diabetes with ketoacidosis - improving, AG closed this AM - pt reports feeling better - transfer to telemetry floor and continue CBG checks as per floor protocol - increase Lantus to 25 units QHS and continue moderate scale SSI for now - follow up on A1C  Active Problems:  Diabetes mellitus, new onset - provide diabetic education    EDUCATION - test results and diagnostic studies were discussed with patient  - patient erbalized the understanding - questions were answered at the bedside and contact information was provided for additional questions or concerns   LOS: 1 day   MAGICK-Skarlett Sedlacek 01/17/2012, 8:23 AM  [facility] Pager: [phone]

## 2012-01-17 NOTE — Progress Notes (Signed)
Inpatient Diabetes Program Recommendations  AACE/ADA: New Consensus Statement on Inpatient Glycemic Control (2009)  Target Ranges:  Prepandial:   less than 140 mg/dL      Peak postprandial:   less than 180 mg/dL (1-2 hours)      Critically ill patients:  140 - 180 mg/dL   Reason for Visit: Consult- New onset diabetes  Inpatient Diabetes Program Recommendations Insulin - Basal: Patient has no insurance. MD may want to consider changing patient from Lantus/Novolog regimen to ReliOn 70/30 from [employer] Outpatient Referral: Request MD order for outpatient diabetes education follow-up  Note: Met with patient and wife earlier this afternoon. Patient very interested in learning about diabetes self-care. To watch Patient Ed [employer] Gave into regarding purchasing a generic meter from [employer]. Question if patient may benefit from switching to 70/30 regimen as this would be more cost-effective.  Perhaps starting dose could be 70/30 17 units BID with breakfast and supper and titrate upwards according to CBG's.  (If he qualifies for [facility] or some other assistance, he could be changed back to a Lantus/Novolog regimen if appropriate.)  Will need to learn to prepare and give insulin with a vial and syringe as ReliOn insulin is only available in a vial.  (Costs approximately  $25/vial) Will request a case management consult to assist with getting a PCP-- possibly from [facility], and facilitate eligibility screening for [employer], etc.  If patient qualifies for [employer], would be able to use it for outpatient diabetes education at the [facility] after discharge.  Will also request nutrition consult.  Patient interested in extreme physical training-- suggested he discuss this with MD.  .

## 2012-01-18 DIAGNOSIS — E876 Hypokalemia: Secondary | ICD-10-CM | POA: Diagnosis not present

## 2012-01-18 DIAGNOSIS — E860 Dehydration: Secondary | ICD-10-CM | POA: Diagnosis present

## 2012-01-18 DIAGNOSIS — I10 Essential (primary) hypertension: Secondary | ICD-10-CM | POA: Diagnosis present

## 2012-01-18 LAB — GLUCOSE, CAPILLARY
Glucose-Capillary: 271 mg/dL — ABNORMAL HIGH (ref 70–99)
Glucose-Capillary: 314 mg/dL — ABNORMAL HIGH (ref 70–99)
Glucose-Capillary: 351 mg/dL — ABNORMAL HIGH (ref 70–99)

## 2012-01-18 LAB — BASIC METABOLIC PANEL
BUN: 12 mg/dL (ref 6–23)
Chloride: 102 mEq/L (ref 96–112)
GFR calc Af Amer: >90 mL/min (ref >90)
Glucose, Bld: 197 mg/dL — ABNORMAL HIGH (ref 70–99)
Potassium: 2.9 mEq/L — ABNORMAL LOW (ref 3.5–5.1)

## 2012-01-18 LAB — POTASSIUM: Potassium: 4.2 mEq/L (ref 3.5–5.1)

## 2012-01-18 LAB — CBC
HCT: 40.5 % (ref 39.0–52.0)
Hemoglobin: 13.2 g/dL (ref 13.0–17.0)
WBC: 7.9 10*3/uL (ref 4.0–10.5)

## 2012-01-18 MED ORDER — BD GETTING STARTED TAKE HOME KIT: 3/10ML X 30G SYRINGES
1.0000 | Freq: Once | Status: AC
  Administered 2012-01-18: 1
  Filled 2012-01-18: qty 1

## 2012-01-18 MED ORDER — INSULIN ASPART 100 UNIT/ML ~~LOC~~ SOLN: 0.0000 [IU] | Freq: Three times a day (TID) | SUBCUTANEOUS | Status: DC

## 2012-01-18 MED ORDER — INSULIN GLARGINE 100 UNIT/ML ~~LOC~~ SOLN: 30.0000 [IU] | Freq: Every day | SUBCUTANEOUS | Status: DC

## 2012-01-18 MED ORDER — LISINOPRIL 10 MG PO TABS: 5.0000 mg | ORAL_TABLET | Freq: Every day | ORAL | Status: DC

## 2012-01-18 MED ORDER — INSULIN NPH ISOPHANE & REGULAR (70-30) 100 UNIT/ML ~~LOC~~ SUSP: 30.0000 [IU] | Freq: Every day | SUBCUTANEOUS | Status: DC

## 2012-01-18 MED ORDER — INSULIN NPH ISOPHANE & REGULAR (70-30) 100 UNIT/ML ~~LOC~~ SUSP: 25.0000 [IU] | Freq: Every day | SUBCUTANEOUS | Status: DC

## 2012-01-18 MED ORDER — POTASSIUM CHLORIDE CRYS ER 20 MEQ PO TBCR
60.0000 meq | EXTENDED_RELEASE_TABLET | Freq: Once | ORAL | Status: AC
  Administered 2012-01-18: 60 meq via ORAL
  Filled 2012-01-18: qty 3

## 2012-01-18 MED ORDER — INSULIN ASPART 100 UNIT/ML ~~LOC~~ SOLN: 0.0000 [IU] | SUBCUTANEOUS | Status: DC

## 2012-01-18 MED ORDER — INSULIN NPH ISOPHANE & REGULAR (70-30) 100 UNIT/ML ~~LOC~~ SUSP: 20.0000 [IU] | Freq: Every day | SUBCUTANEOUS | Status: DC

## 2012-01-18 MED ORDER — INSULIN GLARGINE 100 UNIT/ML ~~LOC~~ SOLN: 25.0000 [IU] | Freq: Every day | SUBCUTANEOUS | Status: DC

## 2012-01-18 NOTE — Progress Notes (Addendum)
Follow-up visit from yesterday.  Patient has watched all Pt Ed Videos regarding diabetes.  Able to recall/discuss content of videos.  Ready to begin giving own insulin.  Nurse to begin insulin administration instruction.   Patient continues to be engaged during my visit and actively trying to problem-solve regarding incorporating diabetes into lifestyle. (Please see yesterday's note regarding possibly discharging patient with prescription for ReliOn 70/30 insulin from [employer] as a more cost-effective way to manage his diabetes.) If patient is changed to 70/30 regimen, request that MD select vial instead of insulin pen for patient so he can give insulin with vial and syringe while here.  Glucose pattern suggests that patient will likely need 70/30 25 units twice daily initially instead of previous recommendation.  Has already received 37 units of correction thus far today.  If patient not changed to 70/30 insulin, will need meal coverage- at least 4 to 6 units Novolog tid with meals.  Won't need meal coverage if changed to 70/30 regimen.

## 2012-01-18 NOTE — Progress Notes (Signed)
Patient was d/c this evening after his potassium was stable. He was educated on  The insulin administration at home , where and when to give it. And the need to check his blood sugar when he doesn't feel well. His assessments remained stable and unchanged prior to discharge.

## 2012-01-18 NOTE — Progress Notes (Signed)
[facility] eligibility appt arranged for 02/04/12, appt with Dr. Georganna Skeans 02/27/12.  Referral to [facility] for community f/u.  Pt to attend classes @ [facility].

## 2012-01-18 NOTE — Discharge Instructions (Signed)
Eligibility appt @ [facility] Friday, 02/04/12 @ 9:30 a.m.  Appt with Dr. Georganna Skeans Wednesday, 03/08/12 @ 9:30 a.m.Blood Sugar Monitoring, Adult GLUCOSE METERS FOR SELF-MONITORING OF BLOOD GLUCOSE  It is important to be able to correctly measure your blood sugar (glucose). You can use a blood glucose monitor (a small battery-operated device) to check your glucose level at any time. This allows you and your caregiver to monitor your diabetes and to determine how well your treatment plan is working. The process of monitoring your blood glucose with a glucose meter is called self-monitoring of blood glucose (SMBG). When people with diabetes control their blood sugar, they have better health. To test for glucose with a typical glucose meter, place the disposable strip in the meter. Then place a small sample of blood on the "test strip." The test strip is coated with chemicals that combine with glucose in blood. The meter measures how much glucose is present. The meter displays the glucose level as a number. Several new models can record and store a number of test results. Some models can connect to personal computers to store test results or print them out.  Newer meters are often easier to use than older models. Some meters allow you to get blood from places other than your fingertip. Some new models have automatic timing, error codes, signals, or barcode readers to help with proper adjustment (calibration). Some meters have a large display screen or spoken instructions for people with visual impairments.  INSTRUCTIONS FOR USING GLUCOSE METERS  Wash your hands with soap and warm water, or clean the area with alcohol. Dry your hands completely.   Prick the side of your fingertip with a lancet (a sharp-pointed tool used by hand).   Hold the hand down and gently milk the finger until a small drop of blood appears. Catch the blood with the test strip.   Follow the instructions for inserting the test  strip and using the SMBG meter. Most meters require the meter to be turned on and the test strip to be inserted before applying the blood sample.   Record the test result.   Read the instructions carefully for both the meter and the test strips that go with it. Meter instructions are found in the user manual. Keep this manual to help you solve any problems that may arise. Many meters use "error codes" when there is a problem with the meter, the test strip, or the blood sample on the strip. You will need the manual to understand these error codes and fix the problem.   New devices are available such as laser lancets and meters that can test blood taken from "alternative sites" of the body, other than fingertips. However, you should use standard fingertip testing if your glucose changes rapidly. Also, use standard testing if:   You have eaten, exercised, or taken insulin in the past 2 hours.   You think your glucose is low.   You tend to not feel symptoms of low blood glucose (hypoglycemia).   You are ill or under stress.   Clean the meter as directed by the manufacturer.   Test the meter for accuracy as directed by the manufacturer.   Take your meter with you to your caregiver's office. This way, you can test your glucose in front of your caregiver to make sure you are using the meter correctly. Your caregiver can also take a sample of blood to test using a routine lab method. If values on the  glucose meter are close to the lab results, you and your caregiver will see that your meter is working well and you are using good technique. Your caregiver will advise you about what to do if the results do not match.  FREQUENCY OF TESTING  Your caregiver will tell you how often you should check your blood glucose. This will depend on your type of diabetes, your current level of diabetes control, and your types of medicines. The following are general guidelines, but your care plan may be different. Record  all your readings and the time of day you took them for review with your caregiver.   Diabetes type 1.   When you are using insulin with good diabetic control (either multiple daily injections or via a pump), you should check your glucose 4 times a day.   If your diabetes is not well controlled, you may need to monitor more frequently, including before meals and 2 hours after meals, at bedtime, and occasionally between 2 a.m. and 3 a.m.   You should always check your glucose before a dose of insulin or before changing the rate on your insulin pump.   Diabetes type 2.   Guidelines for SMBG in diabetes type 2 are not as well defined.   If you are on insulin, follow the guidelines above.   If you are on medicines, but not insulin, and your glucose is not well controlled, you should test at least twice daily.   If you are not on insulin, and your diabetes is controlled with medicines or diet alone, you should test at least once daily, usually before breakfast.   A weekly profile will help your caregiver advise you on your care plan. The week before your visit, check your glucose before a meal and 2 hours after a meal at least daily. You may want to test before and after a different meal each day so you and your caregiver can tell how well controlled your blood sugars are throughout the course of a 24 hour period.   Gestational diabetes (diabetes during pregnancy).   Frequent testing is often necessary. Accurate timing is important.   If you are not on insulin, check your glucose 4 times a day. Check it before breakfast and 1 hour after the start of each meal.   If you are on insulin, check your glucose 6 times a day. Check it before each meal and 1 hour after the first bite of each meal.   General guidelines.   More frequent testing is required at the start of insulin treatment. Your caregiver will instruct you.   Test your glucose any time you suspect you have low blood sugar  (hypoglycemia).   You should test more often when you change medicines, when you have unusual stress or illness, or in other unusual circumstances.  OTHER THINGS TO KNOW ABOUT GLUCOSE METERS  Measurement Range. Most glucose meters are able to read glucose levels over a broad range of values from as low as 0 to as high as 600 mg/dL. If you get an extremely high or low reading from your meter, you should first confirm it with another reading. Report very high or very low readings to your caregiver.   Whole Blood Glucose versus Plasma Glucose. Some older home glucose meters measure glucose in your whole blood. In a lab or when using some newer home glucose meters, the glucose is measured in your plasma (one component of blood). The difference can be important. It is  important for you and your caregiver to know whether your meter gives its results as "whole blood equivalent" or "plasma equivalent."   Display of High and Low Glucose Values. Part of learning how to operate a meter is understanding what the meter results mean. Know how high and low glucose concentrations are displayed on your meter.   Factors that Affect Glucose Meter Performance. The accuracy of your test results depends on many factors and varies depending on the brand and type of meter. These factors include:   Low red blood cell count (anemia).   Substances in your blood (such as uric acid, vitamin C, and others).   Environmental factors (temperature, humidity, altitude).   Name-brand versus generic test strips.   Calibration. Make sure your meter is set up properly. It is a good idea to do a calibration test with a control solution recommended by the manufacturer of your meter whenever you begin using a fresh bottle of test strips. This will help verify the accuracy of your meter.   Improperly stored, expired, or defective test strips. Keep your strips in a dry place with the lid on.   Soiled meter.   Inadequate blood  sample.  NEW TECHNOLOGIES FOR GLUCOSE TESTING Alternative site testing Some glucose meters allow testing blood from alternative sites. These include the:  Upper arm.   Forearm.   Base of the thumb.   Thigh.  Sampling blood from alternative sites may be desirable. However, it may have some limitations. Blood in the fingertips show changes in glucose levels more quickly than blood in other parts of the body. This means that alternative site test results may be different from fingertip test results, not because of the meter's ability to test accurately, but because the actual glucose concentration can be different.  Continuous Glucose Monitoring Devices to measure your blood glucose continuously are available, and others are in development. These methods can be more expensive than self-monitoring with a glucose meter. However, it is uncertain how effective and reliable these devices are. Your caregiver will advise you if this approach makes sense for you. IF BLOOD SUGARS ARE CONTROLLED, PEOPLE WITH DIABETES REMAIN HEALTHIER.  SMBG is an important part of the treatment plan of patients with diabetes mellitus. Below are reasons for using SMBG:   It confirms that your glucose is at a specific, healthy level.   It detects hypoglycemia and severe hyperglycemia.   It allows you and your caregiver to make adjustments in response to changes in lifestyle for individuals requiring medicine.   It determines the need for starting insulin therapy in temporary diabetes that happens during pregnancy (gestational diabetes).  Document Released: 12/16/2003 Document Revised: 08/26/2011 Document Reviewed: 04/08/2011 [facility] Patient Information 2012 [place], [place].

## 2012-01-18 NOTE — Discharge Summary (Signed)
DISCHARGE SUMMARY  Erik Chung  MR#: [id number]  DOB:01-26-1971  Date of Admission: 01/16/2012 Date of Discharge: 01/18/2012  Attending Physician:Audrey Thull  Patient's PCP: None- [facility]  Consults: none  Presenting Complaint: Polyuria, polydipsia, unintentional weight loss  Discharge Diagnoses: Diabetes with ketoacidosis Diabetes mellitus, new onset HTN Dehydration     Discharge Medications: Medication List  As of 01/18/2012 12:23 PM   TAKE these medications         insulin NPH-insulin regular (70-30) 100 UNIT/ML injection   Commonly known as: NOVOLIN 70/30   Inject 20 Units into the skin daily with breakfast.      lisinopril 10 MG tablet   Commonly known as: PRINIVIL,ZESTRIL   Take 0.5 tablets (5 mg total) by mouth daily.             Procedures: Dg Chest 2 View  01/16/2012  *RADIOLOGY REPORT*  Clinical Data: Fatigue  CHEST - 2 VIEW  Comparison: 10/05/2008  Findings: Lungs are clear. No pleural effusion or pneumothorax.  Cardiomediastinal silhouette is within normal limits.  Mild degenerative changes of the visualized thoracolumbar spine.  IMPRESSION: Normal chest radiographs.  Original Report Authenticated By: Charline Bills, M.D.    Hospital Course:   *Diabetes with ketoacidosis/ Diabetes mellitus, new onset This is a 41 y/o male who presented with the above mentioned complaints including a 40 lb weight loss. He was treated for DKA with an Glucose stablizer, aggressive hydration and potassium replacement. He has been transtioned to Lantus and Novolog but will not be able to afford these and therefore, once his pens have finished, he will start 70/30. He has received education regarding this new diagnosis and diet changes. He will also f/u with the outpt diabetes teaching program and be starting an exercise program to help him continue to lose weight.   HTN-  BP was noted to be high on admission and has continued to be mildly elevated.  I have started a very low dose Lisinopril and ask him to watch his salt intake.   Dehydration  Was due to DKA and now resolved.   Hypokalemia Potassium replaced and improved to 4.2.    Day of Discharge Physical Exam: BP 121/72  Pulse 74  Temp(Src) 97.6 F (36.4 C) (Oral)  Resp 18  Ht 5\' 11"  (1.803 m)  Wt 99.8 kg (220 lb 0.3 oz)  BMI 30.69 kg/m2  SpO2 96%  General: Alert, awake, oriented x3, in no acute distress.  HEENT: No bruits, no goiter. Moist mucous membranes, no scleral icterus, no conjunctival pallor.  Heart: Regular rate and rhythm, S1/S2 +, no murmurs, rubs, gallops.  Lungs: Clear to auscultation bilaterally. No wheezing, no rhonchi, no rales.  Abdomen: Soft, nontender, nondistended, positive bowel sounds.  Extremities: No clubbing or cyanosis, no pitting edema, positive pedal pulses.  Neuro: Grossly nonfocal   Results for orders placed during the hospital encounter of 01/16/12 (from the past 24 hour(s))  GLUCOSE, CAPILLARY     Status: Abnormal   Collection Time   01/17/12  4:19 PM      Component Value Range   Glucose-Capillary 409 (*) 70 - 99 (mg/dL)  GLUCOSE, CAPILLARY     Status: Abnormal   Collection Time   01/17/12  6:01 PM      Component Value Range   Glucose-Capillary 417 (*) 70 - 99 (mg/dL)  GLUCOSE, CAPILLARY     Status: Abnormal   Collection Time   01/17/12  8:07 PM      Component  Value Range   Glucose-Capillary 260 (*) 70 - 99 (mg/dL)  GLUCOSE, CAPILLARY     Status: Abnormal   Collection Time   01/18/12 12:09 AM      Component Value Range   Glucose-Capillary 351 (*) 70 - 99 (mg/dL)  GLUCOSE, CAPILLARY     Status: Abnormal   Collection Time   01/18/12  3:46 AM      Component Value Range   Glucose-Capillary 271 (*) 70 - 99 (mg/dL)  CBC     Status: Normal   Collection Time   01/18/12  6:15 AM      Component Value Range   WBC 7.9  4.0 - 10.5 (K/uL)   RBC 4.84  4.22 - 5.81 (MIL/uL)   Hemoglobin 13.2  13.0 - 17.0 (g/dL)   HCT 19.1  47.8 - 29.5 (%)    MCV 83.7  78.0 - 100.0 (fL)   MCH 27.3  26.0 - 34.0 (pg)   MCHC 32.6  30.0 - 36.0 (g/dL)   RDW 62.1  30.8 - 65.7 (%)   Platelets 161  150 - 400 (K/uL)  BASIC METABOLIC PANEL     Status: Abnormal   Collection Time   01/18/12  6:15 AM      Component Value Range   Sodium 138  135 - 145 (mEq/L)   Potassium 2.9 (*) 3.5 - 5.1 (mEq/L)   Chloride 102  96 - 112 (mEq/L)   CO2 27  19 - 32 (mEq/L)   Glucose, Bld 197 (*) 70 - 99 (mg/dL)   BUN 12  6 - 23 (mg/dL)   Creatinine, Ser 8.46  0.50 - 1.35 (mg/dL)   Calcium 8.8  8.4 - 96.2 (mg/dL)   GFR calc non Af Amer >90  >90 (mL/min)   GFR calc Af Amer >90  >90 (mL/min)  GLUCOSE, CAPILLARY     Status: Abnormal   Collection Time   01/18/12  7:45 AM      Component Value Range   Glucose-Capillary 191 (*) 70 - 99 (mg/dL)  GLUCOSE, CAPILLARY     Status: Abnormal   Collection Time   01/18/12 11:57 AM      Component Value Range   Glucose-Capillary 314 (*) 70 - 99 (mg/dL)    Disposition: stable   Follow-up Appts: Discharge Orders    Future Orders Please Complete By Expires   Diet - low sodium heart healthy      Comments:   And moderate carb controlled   Increase activity slowly      Discharge instructions      Comments:   1. Try to eat breakfast and dinner 12 hrs apart and at the same time each day. Its good to eat 5-6 small meals rather than 2-3 big meals.  2. Give the 70/30 insulin 30 min prior to meal.  3. Keep a piece of quickly dissolving candy with you at all times. Tablets specific for this are available as well at the pharmacy.  4. If you feel like your sugar is low, make sure to check it first before eating anything.  5. Keep a log of what time you eat, what you eat and how much insulin you gave yourself.        Follow-up with [facility] -eligibility appt- 02/04/12 -appt with physician Georganna Skeans, MD 02/27/12   Time on Discharge: >45 min  Signed: Calvert Cantor 01/18/2012, 12:23 PM

## 2012-01-18 NOTE — Plan of Care (Addendum)
Problem: Food- and Nutrition-Related Knowledge Deficit (NB-1.1) Goal: Nutrition education Formal process to instruct or train a patient/client in a skill or to impart knowledge to help patients/clients voluntarily manage or modify food choices and eating behavior to maintain or improve health.  Outcome: Completed/Met Date Met:  01/18/12 RD consult for DM diet education. Reviewed guidelines and recommendations. Handouts provided from [employer]. Pt currently on a Carbohydrate Medium Calorie diet with 75-100% PO intake. Pt triggered for unintentional weight loss per nutrition screen however confirmed this was in fact intentional as he was actively trying to lose weight. BMI = 30.6 kg/m2 (Obesity Class I). Expect good compliance. No further nutrition intervention indicated at this time.  Alger Memos, RD, LDN Pager # [phone]

## 2012-01-21 LAB — BETA-HYDROXYBUTYRIC ACID: Beta-Hydroxybutyric Acid: 4 mmol/L — ABNORMAL HIGH

## 2012-08-31 ENCOUNTER — Encounter (HOSPITAL_COMMUNITY): Payer: Self-pay | Admitting: Emergency Medicine

## 2012-08-31 ENCOUNTER — Emergency Department (HOSPITAL_COMMUNITY)
Admission: EM | Admit: 2012-08-31 | Discharge: 2012-08-31 | Disposition: A | Discharge status: Home / Self Care | Payer: Self-pay | Attending: Emergency Medicine | Admitting: Emergency Medicine

## 2012-08-31 DIAGNOSIS — E119 Type 2 diabetes mellitus without complications: Secondary | ICD-10-CM | POA: Insufficient documentation

## 2012-08-31 DIAGNOSIS — Z91013 Allergy to seafood: Secondary | ICD-10-CM | POA: Insufficient documentation

## 2012-08-31 DIAGNOSIS — I10 Essential (primary) hypertension: Secondary | ICD-10-CM | POA: Insufficient documentation

## 2012-08-31 DIAGNOSIS — K0889 Other specified disorders of teeth and supporting structures: Secondary | ICD-10-CM

## 2012-08-31 DIAGNOSIS — Z794 Long term (current) use of insulin: Secondary | ICD-10-CM | POA: Insufficient documentation

## 2012-08-31 DIAGNOSIS — Z880 Allergy status to penicillin: Secondary | ICD-10-CM | POA: Insufficient documentation

## 2012-08-31 DIAGNOSIS — F17200 Nicotine dependence, unspecified, uncomplicated: Secondary | ICD-10-CM | POA: Insufficient documentation

## 2012-08-31 DIAGNOSIS — K089 Disorder of teeth and supporting structures, unspecified: Secondary | ICD-10-CM | POA: Insufficient documentation

## 2012-08-31 HISTORY — DX: Essential (primary) hypertension: I10

## 2012-08-31 MED ORDER — HYDROCODONE-ACETAMINOPHEN 5-325 MG PO TABS: 1.0000 | ORAL_TABLET | ORAL | Status: AC | PRN

## 2012-08-31 MED ORDER — IBUPROFEN 800 MG PO TABS: 800.0000 mg | ORAL_TABLET | Freq: Three times a day (TID) | ORAL | Status: AC | PRN

## 2012-08-31 MED ORDER — CLINDAMYCIN HCL 300 MG PO CAPS: 300.0000 mg | ORAL_CAPSULE | Freq: Four times a day (QID) | ORAL | Status: AC

## 2012-08-31 NOTE — ED Provider Notes (Signed)
History     CSN: [id number]  Arrival date & time 08/31/12  0159   First MD Initiated Contact with Patient 08/31/12 [phone]      Chief Complaint  Patient presents with  . Dental Pain    (Consider location/radiation/quality/duration/timing/severity/associated sxs/prior treatment) HPI Comments: Patient reports he has had three days of left upper molar pain.  Pain is constant and "serious" but eases up occasionally.  Has been taking tylenol and ibuprofen without relief.  Has a known cavity in this region.  Pt has also had a cough x 2 weeks that is productive but without SOB or CP.  No fevers.  No difficulty swallowing or breathing.  Was a [facility] Patient.  Does not have a dentist.    Patient is a 41 y.o. male presenting with tooth pain. The history is provided by the patient.  Dental PainThe primary symptoms include cough. Primary symptoms do not include fever, shortness of breath or sore throat.  Additional symptoms do not include: trouble swallowing.    Past Medical History  Diagnosis Date  . No pertinent past medical history   . Diabetes mellitus   . Hypertension     History reviewed. No pertinent past surgical history.  Family History  Problem Relation Age of Onset  . Diabetes Mother     History  Substance Use Topics  . Smoking status: Current Everyday Smoker -- 1.0 packs/day    Types: Cigarettes  . Smokeless tobacco: Not on file  . Alcohol Use: 0.6 oz/week    1 Glasses of wine per week      Review of Systems  Constitutional: Negative for fever and chills.  HENT: Positive for dental problem. Negative for sore throat and trouble swallowing.   Respiratory: Positive for cough. Negative for shortness of breath.   Cardiovascular: Negative for chest pain.    Allergies  Penicillins and Shellfish allergy  Home Medications   Current Outpatient Rx  Name Route Sig Dispense Refill  . ACETAMINOPHEN 500 MG PO TABS Oral Take 1,000 mg by mouth every 6 (six) hours as  needed. Dental pain    . IBUPROFEN 200 MG PO TABS Oral Take 800 mg by mouth every 6 (six) hours as needed. Pain    . INSULIN ISOPHANE & REGULAR (70-30) 100 UNIT/ML [place] SUSP Subcutaneous Inject 20-30 Units into the skin daily with breakfast. 30 units in the morning and 20 units at night    . LISINOPRIL 10 MG PO TABS Oral Take 10 mg by mouth daily.    Marland Kitchen METFORMIN HCL 500 MG PO TABS Oral Take 500 mg by mouth 2 (two) times daily with a meal.    . CLINDAMYCIN HCL 300 MG PO CAPS Oral Take 1 capsule (300 mg total) by mouth 4 (four) times daily. 40 capsule 0  . HYDROCODONE-ACETAMINOPHEN 5-325 MG PO TABS Oral Take 1 tablet by mouth every 4 (four) hours as needed for pain. 15 tablet 0  . IBUPROFEN 800 MG PO TABS Oral Take 1 tablet (800 mg total) by mouth every 8 (eight) hours as needed for pain. 21 tablet 0    BP 164/94  Pulse 71  Temp 98 F (36.7 C) (Oral)  SpO2 94%  Physical Exam  Nursing note and vitals reviewed. Constitutional: He appears well-developed and well-nourished. No distress.  HENT:  Head: Normocephalic and atraumatic.  Mouth/Throat: Uvula is midline and oropharynx is clear and moist. Mucous membranes are not dry. No uvula swelling. No oropharyngeal exudate, posterior oropharyngeal edema or posterior  oropharyngeal erythema.         Left upper third molar tender to percussion. No gingival swelling or tenderness.  No facial swelling.    Neck: Normal range of motion. Neck supple. No tracheal tenderness present. No tracheal deviation present.  Cardiovascular: Normal rate and regular rhythm.   Pulmonary/Chest: Effort normal and breath sounds normal. No stridor. No respiratory distress. He has no wheezes. He has no rales.  Lymphadenopathy:    He has no cervical adenopathy.  Neurological: He is alert.  Skin: He is not diaphoretic.    ED Course  Procedures (including critical care time)  Labs Reviewed - No data to display No results found.   1. Pain, dental       MDM    Patient with several days of dental pain.  No fevers, nontoxic.  No obvious abscess.  Doubt ludwig's angina or deeper infection.  Pt allergic to penicillin, have given clindamycin Rx, ibuprofen and norco.  D/C home with dental follow up.  Discussed diagnosis and plan with patient.  Pt given return precautions.  Pt verbalizes understanding and agrees with plan.           [place], [place] 08/31/12 [phone]

## 2012-08-31 NOTE — ED Provider Notes (Signed)
Medical screening examination/treatment/procedure(s) were performed by non-physician practitioner and as supervising physician I was immediately available for consultation/collaboration.  Jerrell Hart M Azion Centrella, MD 08/31/12 2135 

## 2012-08-31 NOTE — ED Notes (Signed)
Pt alert, arrives from home, c/o dental pain, onset several days ago, resp even unlabored, skin pwd

## 2012-08-31 NOTE — ED Notes (Signed)
Pt c/o upper L jaw pain x 3 days, pt states this is due to cavities. Pt states he has not taken anything for pain nor has he been seen by dentist. Pt requesting work note for today.

## 2014-02-24 ENCOUNTER — Inpatient Hospital Stay (HOSPITAL_COMMUNITY)
Admission: EM | Admit: 2014-02-24 | Discharge: 2014-02-26 | DRG: 440 | Disposition: A | Discharge status: Home / Self Care | Payer: 59 | Attending: Internal Medicine | Admitting: Internal Medicine

## 2014-02-24 ENCOUNTER — Encounter (HOSPITAL_COMMUNITY): Payer: Self-pay | Admitting: Emergency Medicine

## 2014-02-24 DIAGNOSIS — R739 Hyperglycemia, unspecified: Secondary | ICD-10-CM

## 2014-02-24 DIAGNOSIS — E119 Type 2 diabetes mellitus without complications: Secondary | ICD-10-CM | POA: Diagnosis present

## 2014-02-24 DIAGNOSIS — F17200 Nicotine dependence, unspecified, uncomplicated: Secondary | ICD-10-CM | POA: Diagnosis present

## 2014-02-24 DIAGNOSIS — E876 Hypokalemia: Secondary | ICD-10-CM

## 2014-02-24 DIAGNOSIS — Z794 Long term (current) use of insulin: Secondary | ICD-10-CM

## 2014-02-24 DIAGNOSIS — K859 Acute pancreatitis without necrosis or infection, unspecified: Principal | ICD-10-CM | POA: Diagnosis present

## 2014-02-24 DIAGNOSIS — E860 Dehydration: Secondary | ICD-10-CM

## 2014-02-24 DIAGNOSIS — I10 Essential (primary) hypertension: Secondary | ICD-10-CM | POA: Diagnosis present

## 2014-02-24 MED ORDER — ACETAMINOPHEN 325 MG PO TABS
650.0000 mg | ORAL_TABLET | Freq: Once | ORAL | Status: AC
  Administered 2014-02-24: 650 mg via ORAL
  Filled 2014-02-24: qty 2

## 2014-02-24 NOTE — ED Notes (Signed)
Pt. reports left abdominal pain onset 2 days ago , denies nausea or vomitting / no diarrhea. denies fever or chills.

## 2014-02-25 ENCOUNTER — Encounter (HOSPITAL_COMMUNITY): Payer: Self-pay | Admitting: *Deleted

## 2014-02-25 ENCOUNTER — Emergency Department (HOSPITAL_COMMUNITY): Payer: 59

## 2014-02-25 DIAGNOSIS — R7309 Other abnormal glucose: Secondary | ICD-10-CM

## 2014-02-25 DIAGNOSIS — I10 Essential (primary) hypertension: Secondary | ICD-10-CM

## 2014-02-25 DIAGNOSIS — E119 Type 2 diabetes mellitus without complications: Secondary | ICD-10-CM

## 2014-02-25 DIAGNOSIS — K859 Acute pancreatitis without necrosis or infection, unspecified: Principal | ICD-10-CM | POA: Diagnosis present

## 2014-02-25 LAB — GLUCOSE, CAPILLARY
GLUCOSE-CAPILLARY: 283 mg/dL — AB (ref 70–99)
GLUCOSE-CAPILLARY: 312 mg/dL — AB (ref 70–99)
Glucose-Capillary: 215 mg/dL — ABNORMAL HIGH (ref 70–99)

## 2014-02-25 LAB — HEPATIC FUNCTION PANEL
ALBUMIN: 3.4 g/dL — AB (ref 3.5–5.2)
ALT: 5 U/L (ref 0–53)
AST: 5 U/L (ref 0–37)
Alkaline Phosphatase: 53 U/L (ref 39–117)
BILIRUBIN DIRECT: 0.2 mg/dL (ref 0.0–0.3)
Indirect Bilirubin: 0.2 mg/dL — ABNORMAL LOW (ref 0.3–0.9)
TOTAL PROTEIN: 0.4 g/dL — AB (ref 6.0–8.3)
Total Bilirubin: 0.4 mg/dL (ref 0.3–1.2)

## 2014-02-25 LAB — CBC WITH DIFFERENTIAL/PLATELET
BASOS ABS: 0 10*3/uL (ref 0.0–0.1)
Basophils Relative: 0 % (ref 0–1)
EOS ABS: 0.1 10*3/uL (ref 0.0–0.7)
Eosinophils Relative: 1 % (ref 0–5)
HCT: 44.1 % (ref 39.0–52.0)
Hemoglobin: 16.3 g/dL (ref 13.0–17.0)
LYMPHS PCT: 17 % (ref 12–46)
Lymphs Abs: 1.7 10*3/uL (ref 0.7–4.0)
MCH: 30.5 pg (ref 26.0–34.0)
MCHC: 37 g/dL — AB (ref 30.0–36.0)
MCV: 82.4 fL (ref 78.0–100.0)
Monocytes Absolute: 0.8 10*3/uL (ref 0.1–1.0)
Monocytes Relative: 8 % (ref 3–12)
NEUTROS ABS: 7.6 10*3/uL (ref 1.7–7.7)
NEUTROS PCT: 74 % (ref 43–77)
Platelets: 283 10*3/uL (ref 150–400)
RBC: 5.35 MIL/uL (ref 4.22–5.81)
RDW: 13.9 % (ref 11.5–15.5)
WBC: 10.2 10*3/uL (ref 4.0–10.5)

## 2014-02-25 LAB — URINALYSIS, ROUTINE W REFLEX MICROSCOPIC
LEUKOCYTES UA: NEGATIVE
Nitrite: NEGATIVE
PROTEIN: 100 mg/dL — AB
Specific Gravity, Urine: 1.043 — ABNORMAL HIGH (ref 1.005–1.030)
Urobilinogen, UA: 0.2 mg/dL (ref 0.0–1.0)
pH: 5.5 (ref 5.0–8.0)

## 2014-02-25 LAB — URINE MICROSCOPIC-ADD ON

## 2014-02-25 LAB — ETHANOL: Alcohol, Ethyl (B): <11 mg/dL (ref 0–11)

## 2014-02-25 LAB — I-STAT CHEM 8, ED
BUN: 7 mg/dL (ref 6–23)
CREATININE: 0.6 mg/dL (ref 0.50–1.35)
Calcium, Ion: 1.15 mmol/L (ref 1.12–1.23)
Chloride: 103 mEq/L (ref 96–112)
Glucose, Bld: 252 mg/dL — ABNORMAL HIGH (ref 70–99)
HEMATOCRIT: 49 % (ref 39.0–52.0)
HEMOGLOBIN: 16.7 g/dL (ref 13.0–17.0)
POTASSIUM: 4.7 meq/L (ref 3.7–5.3)
SODIUM: 133 meq/L — AB (ref 137–147)
TCO2: 18 mmol/L (ref 0–100)

## 2014-02-25 LAB — TRIGLYCERIDES: Triglycerides: 1475 mg/dL — ABNORMAL HIGH (ref <150)

## 2014-02-25 LAB — CBG MONITORING, ED: Glucose-Capillary: 251 mg/dL — ABNORMAL HIGH (ref 70–99)

## 2014-02-25 LAB — LIPASE, BLOOD: LIPASE: 48 U/L (ref 11–59)

## 2014-02-25 MED ORDER — SODIUM CHLORIDE 0.9 % IV BOLUS (SEPSIS)
1000.0000 mL | Freq: Once | INTRAVENOUS | Status: AC
  Administered 2014-02-25: 1000 mL via INTRAVENOUS

## 2014-02-25 MED ORDER — INSULIN ASPART 100 UNIT/ML ~~LOC~~ SOLN
0.0000 [IU] | Freq: Three times a day (TID) | SUBCUTANEOUS | Status: DC
  Administered 2014-02-25: 8 [IU] via SUBCUTANEOUS
  Administered 2014-02-25: 5 [IU] via SUBCUTANEOUS
  Administered 2014-02-26: 8 [IU] via SUBCUTANEOUS

## 2014-02-25 MED ORDER — INSULIN DETEMIR 100 UNIT/ML ~~LOC~~ SOLN
10.0000 [IU] | Freq: Every day | SUBCUTANEOUS | Status: DC
  Administered 2014-02-25 – 2014-02-26 (×2): 10 [IU] via SUBCUTANEOUS
  Filled 2014-02-25 (×2): qty 0.1

## 2014-02-25 MED ORDER — IOHEXOL 300 MG/ML  SOLN
20.0000 mL | Freq: Once | INTRAMUSCULAR | Status: AC | PRN
  Administered 2014-02-25: 20 mL via ORAL

## 2014-02-25 MED ORDER — MORPHINE SULFATE 4 MG/ML IJ SOLN
4.0000 mg | Freq: Once | INTRAMUSCULAR | Status: AC
  Administered 2014-02-25: 4 mg via INTRAVENOUS
  Filled 2014-02-25: qty 1

## 2014-02-25 MED ORDER — HYDROMORPHONE HCL PF 1 MG/ML IJ SOLN
0.5000 mg | INTRAMUSCULAR | Status: DC | PRN
  Administered 2014-02-25 – 2014-02-26 (×3): 0.5 mg via INTRAVENOUS
  Filled 2014-02-25 (×3): qty 1

## 2014-02-25 MED ORDER — INSULIN ASPART 100 UNIT/ML ~~LOC~~ SOLN
0.0000 [IU] | Freq: Every day | SUBCUTANEOUS | Status: DC
  Administered 2014-02-25: 4 [IU] via SUBCUTANEOUS

## 2014-02-25 MED ORDER — ENOXAPARIN SODIUM 40 MG/0.4ML ~~LOC~~ SOLN
40.0000 mg | SUBCUTANEOUS | Status: DC
  Administered 2014-02-25: 40 mg via SUBCUTANEOUS
  Filled 2014-02-25 (×2): qty 0.4

## 2014-02-25 MED ORDER — ONDANSETRON HCL 4 MG/2ML IJ SOLN: 4.0000 mg | Freq: Once | INTRAMUSCULAR | Status: DC

## 2014-02-25 MED ORDER — PANTOPRAZOLE SODIUM 40 MG IV SOLR
40.0000 mg | INTRAVENOUS | Status: DC
  Administered 2014-02-25: 40 mg via INTRAVENOUS
  Filled 2014-02-25 (×2): qty 40

## 2014-02-25 MED ORDER — ONDANSETRON HCL 4 MG/2ML IJ SOLN: 4.0000 mg | Freq: Four times a day (QID) | INTRAMUSCULAR | Status: DC | PRN

## 2014-02-25 MED ORDER — ONDANSETRON HCL 4 MG PO TABS: 4.0000 mg | ORAL_TABLET | Freq: Four times a day (QID) | ORAL | Status: DC | PRN

## 2014-02-25 MED ORDER — SODIUM CHLORIDE 0.9 % IV SOLN
INTRAVENOUS | Status: DC
  Administered 2014-02-25 – 2014-02-26 (×2): via INTRAVENOUS

## 2014-02-25 MED ORDER — HYDROCODONE-ACETAMINOPHEN 5-325 MG PO TABS
1.0000 | ORAL_TABLET | ORAL | Status: DC | PRN
  Administered 2014-02-25: 1 via ORAL
  Filled 2014-02-25: qty 1

## 2014-02-25 MED ORDER — IOHEXOL 300 MG/ML  SOLN
100.0000 mL | Freq: Once | INTRAMUSCULAR | Status: AC | PRN
  Administered 2014-02-25: 100 mL via INTRAVENOUS

## 2014-02-25 NOTE — Discharge Instructions (Signed)
° ° °  Outpatient Metformin Instructions (Glucophage, Glucovance, Fortamet, Riomet, Metaglip, Glumetza, Actoplus met  Avandamet, Janumet)   Patient: Erik Chung                                                02/25/2014:    Radiology Exam:  CT Abd/Pel w/cm   As part of your exam today in the Radiology Department, you were given a radiographic contrast material or x-ray dye.  Because you have had this contrast material and you are taking a Metformin drug (Glucophage, Glucovance, Avandamet, Fortamet, Riomet, Metaglip, Glumetza, Actoplus met, Actoplus Met XR, Prandimet or Janumet), please observe the following instructions:   DO NOT  Take this medication for 48 hours after your exam.  Because you have normal renal function and have no comorbidities, you may restart your medication in 48 hours with no need for a renal function test or consultation with your physician.  You have normal renal function but have some comorbidities.  Comorbidities include liver disease, alcohol overuse, heart failure, myocardial or muscular ischemia, sepsis, or other severe infection.  Therefore you should consult your physician before restarting your medication.  You have impaired renal function.  You should consult your physician before restarting your medication and you are advised to get a renal function test before restarting your medication.  Please discuss this with your physician.   Call your doctor before you start taking this medication again.  Your doctor may want to check your kidney function before you start taking this medication again.  I understand these instructions and have had an opportunity to discuss them with Radiology Department personnel.

## 2014-02-25 NOTE — Progress Notes (Signed)
[facility] PROGRESS NOTE Interim History: 43 y.o. male with Past medical history of diabetes mellitus, hypertension.  The patient is coming from home.  The pt presented with complain of abdominal pain that has been on going since last 3 days. Pain is located in the left abdomen and has been associated with nausea but no vomiting. He woke up with this pain 3 nights ago.  He denies any similar pain before and denies any diarrhea or constipation.   Assessment/Plan: Acute pancreatitis: - Will like to try orals. IV fluids, narcotics for pain. - TG's 1475.   Diabetes mellitus - start levemir and SSI.  HTN (hypertension) -  bp stable.  Code Status: Full  Disposition: Admitted to inpatient in med-surge unit.    Consultants:  none  Procedures:  CT abd  Antibiotics:  none  HPI/Subjective: Pain is unchanged wants to eat. morphine does nothing for pain.    Objective: Filed Vitals:   02/25/14 0600 02/25/14 0642 02/25/14 0715 02/25/14 0745  BP: 127/70 128/66 138/71   Pulse:   100   Temp:    98.7 F (37.1 C)  TempSrc:    Oral  Resp: 20 10 22    SpO2: 95% 98% 94%    No intake or output data in the 24 hours ending 02/25/14 0854 There were no vitals filed for this visit.  Exam:  General: Alert, awake, oriented x3, in no acute distress.  HEENT: No bruits, no goiter.  Heart: Regular rate and rhythm, without murmurs, rubs, gallops.  Lungs: Good air movement, clear  Abdomen: Soft, right flank tenderness, nondistended, positive bowel sounds.  Neuro: Grossly intact, nonfocal.   Data Reviewed: Basic Metabolic Panel:  Recent Labs Lab 02/25/14 0325  NA 133*  K 4.7  CL 103  GLUCOSE 252*  BUN 7  CREATININE 0.60   Liver Function Tests:  Recent Labs Lab 02/25/14 0315  AST 5  ALT 5  ALKPHOS 53  BILITOT 0.4  PROT 0.4*  ALBUMIN 3.4*    Recent Labs Lab 02/25/14 0315  LIPASE 48   No results found for this basename: AMMONIA,  in the last 168  hours CBC:  Recent Labs Lab 02/24/14 2208 02/25/14 0325  WBC 10.2  --   NEUTROABS 7.6  --   HGB 16.3 16.7  HCT 44.1 49.0  MCV 82.4  --   PLT 283  --    Cardiac Enzymes: No results found for this basename: CKTOTAL, CKMB, CKMBINDEX, TROPONINI,  in the last 168 hours BNP (last 3 results) No results found for this basename: PROBNP,  in the last 8760 hours CBG:  Recent Labs Lab 02/25/14 0213  GLUCAP 251*    No results found for this or any previous visit (from the past 240 hour(s)).   Studies: Ct Abdomen Pelvis W Contrast  02/25/2014   CLINICAL DATA:  Abdominal pain.  EXAM: CT ABDOMEN AND PELVIS WITH CONTRAST  TECHNIQUE: Multidetector CT imaging of the abdomen and pelvis was performed using the standard protocol following bolus administration of intravenous contrast.  CONTRAST:  13mL OMNIPAQUE IOHEXOL 300 MG/ML  SOLN  COMPARISON:  None.  FINDINGS: BODY WALL: Small fatty umbilical hernia  LOWER CHEST: Unremarkable.  ABDOMEN/PELVIS:  Liver: Approximately 4 cm mass in the central liver, with peripheral nodular enhancement. No delayed imaging of this lesion to evaluate for centripetal fill-in. No distortion of the neighboring vascular structures. In the absence of chronic liver disease or malignancy, and given similar sized finding on sonography 08/27/2013, this most  consistent with a hemangioma. There are 2 additional, smaller focal lesions, located in segment 6 and in the central right hepatic lobe, measuring 12 mm or smaller. These are indeterminate due to size.  Biliary: No evidence of biliary obstruction or stone.  Pancreas: There is retroperitoneal edema around the mildly expanded tail of the pancreas. The neighboring organs do not appear primarily inflamed. There is no necrosis or fluid collection.  Spleen: Unremarkable.  Adrenals: Unremarkable.  Kidneys and ureters: No hydronephrosis or stone.  Bladder: Unremarkable.  Reproductive: Unremarkable.  Bowel: No obstruction. Normal appendix.   Retroperitoneum: Edema as above.  Peritoneum: No free fluid or gas.  Vascular: Early aortic atherosclerosis  OSSEOUS: No acute abnormalities. Calcification associated with the right S1 nerve root (in the foramen close) , favor post inflammatory.  IMPRESSION: 1. Acute pancreatitis, involving the tail. No necrosis or loculated fluid. 2. 4 cm hepatic mass, appearance most consistent with hemangioma.   Electronically Signed   By: Jorje Guild M.D.   On: 02/25/2014 05:00    Scheduled Meds: . enoxaparin (LOVENOX) injection  40 mg Subcutaneous Q24H  . insulin aspart  0-15 Units Subcutaneous TID WC  . insulin aspart  0-5 Units Subcutaneous QHS  . ondansetron (ZOFRAN) IV  4 mg Intravenous Once  . pantoprazole (PROTONIX) IV  40 mg Intravenous Q24H   Continuous Infusions: . sodium chloride       Charlynne Cousins  [facility] Pager [phone]. If 8PM-8AM, please contact night-coverage at [email or web address], password [facility] 02/25/2014, 8:54 AM  LOS: 1 day

## 2014-02-25 NOTE — ED Provider Notes (Signed)
CSN: [id number]     Arrival date & time 02/24/14  2147 History   First MD Initiated Contact with Patient 02/25/14 0004     Chief Complaint  Patient presents with  . Abdominal Pain      Patient is a 43 y.o. male presenting with abdominal pain. The history is provided by the patient.  Abdominal Pain Pain location:  LLQ Pain quality: aching   Pain radiates to:  Does not radiate Pain severity:  Moderate Onset quality:  Gradual Duration:  2 days Timing:  Constant Progression:  Worsening Chronicity:  New Relieved by:  Nothing Ineffective treatments: palpation. Associated symptoms: constipation   Associated symptoms: no chest pain, no diarrhea, no dysuria, no shortness of breath and no vomiting     Past Medical History  Diagnosis Date  . No pertinent past medical history   . Diabetes mellitus   . Hypertension    History reviewed. No pertinent past surgical history. Family History  Problem Relation Age of Onset  . Diabetes Mother    History  Substance Use Topics  . Smoking status: Current Every Day Smoker -- 1.00 packs/day    Types: Cigarettes  . Smokeless tobacco: Not on file  . Alcohol Use: 0.6 oz/week    1 Glasses of wine per week    Review of Systems  Respiratory: Negative for shortness of breath.   Cardiovascular: Negative for chest pain.  Gastrointestinal: Positive for abdominal pain and constipation. Negative for vomiting and diarrhea.  Genitourinary: Negative for dysuria.  All other systems reviewed and are negative.      Allergies  Shellfish allergy and Penicillins  Home Medications   Current Outpatient Rx  Name  Route  Sig  Dispense  Refill  . insulin aspart protamine- aspart (NOVOLOG MIX 70/30) (70-30) 100 UNIT/ML injection   Subcutaneous   Inject 30-40 Units into the skin See admin instructions. Take 40 units in the morning and 30 units at bedtime         . lisinopril (PRINIVIL,ZESTRIL) 10 MG tablet   Oral   Take 10 mg by mouth daily.          . metFORMIN (GLUCOPHAGE) 500 MG tablet   Oral   Take 500 mg by mouth daily with breakfast.          BP 141/79  Pulse 109  Temp(Src) 101.4 F (38.6 C) (Oral)  Resp 24  SpO2 97% Physical Exam CONSTITUTIONAL: Well developed/well nourished HEAD: Normocephalic/atraumatic EYES: EOMI/PERRL ENMT: Mucous membranes moist NECK: supple no meningeal signs SPINE:entire spine nontender CV: S1/S2 noted, no murmurs/rubs/gallops noted LUNGS: Lungs are clear to auscultation bilaterally, no apparent distress ABDOMEN: soft, moderate LLQ tenderness, no rebound or guarding No suprapubic tenderness GU:no cva tenderness, no scrotal tenderness, no inguinal hernia noted NEURO: Pt is awake/alert, moves all extremitiesx4 EXTREMITIES: pulses normal, full ROM SKIN: warm, color normal PSYCH: no abnormalities of mood noted  ED Course  Procedures  12:49 AM Pt with h/o DM (unsure his recent glucose reading) presents with LLQ pain for 2 days with fever Will need to obtain CT imaging Pt agreeable 3:09 AM Labs continue to hemolyze Pt stable Will give another bolus of fluids and re-order labs 6:39 AM Acute pancreatitis noted by CT Lipase normal Denies excessive ETOH use Will admit for pain control, IV fluids and monitoring    Labs Review Labs Reviewed  CBC WITH DIFFERENTIAL - Abnormal; Notable for the following:    MCHC 37.0 (*)    All other components within  normal limits  URINALYSIS, ROUTINE W REFLEX MICROSCOPIC  COMPREHENSIVE METABOLIC PANEL  LIPASE, BLOOD   Imaging Review Ct Abdomen Pelvis W Contrast  02/25/2014   CLINICAL DATA:  Abdominal pain.  EXAM: CT ABDOMEN AND PELVIS WITH CONTRAST  TECHNIQUE: Multidetector CT imaging of the abdomen and pelvis was performed using the standard protocol following bolus administration of intravenous contrast.  CONTRAST:  177mL OMNIPAQUE IOHEXOL 300 MG/ML  SOLN  COMPARISON:  None.  FINDINGS: BODY WALL: Small fatty umbilical hernia  LOWER CHEST:  Unremarkable.  ABDOMEN/PELVIS:  Liver: Approximately 4 cm mass in the central liver, with peripheral nodular enhancement. No delayed imaging of this lesion to evaluate for centripetal fill-in. No distortion of the neighboring vascular structures. In the absence of chronic liver disease or malignancy, and given similar sized finding on sonography 08/27/2013, this most consistent with a hemangioma. There are 2 additional, smaller focal lesions, located in segment 6 and in the central right hepatic lobe, measuring 12 mm or smaller. These are indeterminate due to size.  Biliary: No evidence of biliary obstruction or stone.  Pancreas: There is retroperitoneal edema around the mildly expanded tail of the pancreas. The neighboring organs do not appear primarily inflamed. There is no necrosis or fluid collection.  Spleen: Unremarkable.  Adrenals: Unremarkable.  Kidneys and ureters: No hydronephrosis or stone.  Bladder: Unremarkable.  Reproductive: Unremarkable.  Bowel: No obstruction. Normal appendix.  Retroperitoneum: Edema as above.  Peritoneum: No free fluid or gas.  Vascular: Early aortic atherosclerosis  OSSEOUS: No acute abnormalities. Calcification associated with the right S1 nerve root (in the foramen close) , favor post inflammatory.  IMPRESSION: 1. Acute pancreatitis, involving the tail. No necrosis or loculated fluid. 2. 4 cm hepatic mass, appearance most consistent with hemangioma.   Electronically Signed   By: Jorje Guild M.D.   On: 02/25/2014 05:00     MDM   Final diagnoses:  Acute pancreatitis  Hyperglycemia    Nursing notes including past medical history and social history reviewed and considered in documentation Labs/vital reviewed and considered     Sharyon Cable, MD 02/25/14 [phone]

## 2014-02-25 NOTE — H&P (Signed)
[facility] History and Physical  Patient: Erik Chung  VVO:[id number]  DOB: [birth year]  DOS: the patient was seen and examined on 02/25/2014 PCP: Default, Provider, MD  Chief Complaint: abdominal pain  HPI: Erik Chung is a 43 y.o. male with Past medical history of diabetes mellitus, hypertension. The patient is coming from home. The pt presented with complain of abdominal pain that has been on going since last 3 days. Pain is located in the left abdomen and has been associated with nausea but no vomiting. He woke up with this pain 3 nights ago.  He denies any similar pain before and denies any diarrhea or constipation.  Pt denies any fever, chills, headache, cough, chest pain, palpitation, shortness of breath, orthopnea, PND, active bleeding, burning urination, dizziness, pedal edema,  focal neurological deficit.  He not compliant with his insulin and but claims he has been taking his lisinopril and metformin.  Review of Systems: as mentioned in the history of present illness.  A Comprehensive review of the other systems is negative.  Past Medical History  Diagnosis Date  . No pertinent past medical history   . Diabetes mellitus   . Hypertension    History reviewed. No pertinent past surgical history. Social History:  reports that he has been smoking Cigarettes.  He has been smoking about 1.00 pack per day. He does not have any smokeless tobacco history on file. He reports that he drinks about 0.6 ounces of alcohol per week. He reports that he does not use illicit drugs. Independent for most of his  ADL.  Allergies  Allergen Reactions  . Shellfish Allergy Anaphylaxis  . Penicillins Nausea And Vomiting    Family History  Problem Relation Age of Onset  . Diabetes Mother     Prior to Admission medications   Medication Sig Start Date End Date Taking? Authorizing Provider  insulin aspart protamine- aspart (NOVOLOG MIX 70/30) (70-30) 100 UNIT/ML injection Inject 30-40  Units into the skin See admin instructions. Take 40 units in the morning and 30 units at bedtime   Yes Historical Provider, MD  lisinopril (PRINIVIL,ZESTRIL) 10 MG tablet Take 10 mg by mouth daily.   Yes Historical Provider, MD  metFORMIN (GLUCOPHAGE) 500 MG tablet Take 500 mg by mouth daily with breakfast.   Yes Historical Provider, MD    Physical Exam: Filed Vitals:   02/25/14 0344 02/25/14 0500 02/25/14 0515 02/25/14 0600  BP: 112/62 128/70 127/62 127/70  Pulse:  97 101   Temp:      TempSrc:      Resp: 17 28 16 20   SpO2: 97% 97% 93% 95%    General: Alert, Awake and Oriented to Time, Place and Person. Appear in moderate distress Eyes: PERRL ENT: Oral Mucosa clear moist. Neck: no JVD Cardiovascular: S1 and S2 Present, no Murmur, Peripheral Pulses Present Respiratory: Bilateral Air entry equal and Decreased, Clear to Auscultation,  no Crackles,no wheezes Abdomen: Bowel Sound Present, Soft and left lower quadrant tenderness, voluntry guarding but not regidity Skin: no Rash Extremities: no Pedal edema, no calf tenderness Neurologic: Grossly Unremarkable.  Labs on Admission:  CBC:  Recent Labs Lab 02/24/14 2208 02/25/14 0325  WBC 10.2  --   NEUTROABS 7.6  --   HGB 16.3 16.7  HCT 44.1 49.0  MCV 82.4  --   PLT 283  --     CMP     Component Value Date/Time   NA 133* 02/25/2014 0325   K 4.7 02/25/2014 0325   CL  103 02/25/2014 0325   CO2 27 01/18/2012 0615   GLUCOSE 252* 02/25/2014 0325   BUN 7 02/25/2014 0325   CREATININE 0.60 02/25/2014 0325   CALCIUM 8.8 01/18/2012 0615   GFRNONAA >90 01/18/2012 0615   GFRAA >90 01/18/2012 0615     Recent Labs Lab 02/25/14 0315  LIPASE 48   No results found for this basename: AMMONIA,  in the last 168 hours  No results found for this basename: CKTOTAL, CKMB, CKMBINDEX, TROPONINI,  in the last 168 hours BNP (last 3 results) No results found for this basename: PROBNP,  in the last 8760 hours  Radiological Exams on Admission: Ct Abdomen  Pelvis W Contrast  02/25/2014   CLINICAL DATA:  Abdominal pain.  EXAM: CT ABDOMEN AND PELVIS WITH CONTRAST  TECHNIQUE: Multidetector CT imaging of the abdomen and pelvis was performed using the standard protocol following bolus administration of intravenous contrast.  CONTRAST:  124mL OMNIPAQUE IOHEXOL 300 MG/ML  SOLN  COMPARISON:  None.  FINDINGS: BODY WALL: Small fatty umbilical hernia  LOWER CHEST: Unremarkable.  ABDOMEN/PELVIS:  Liver: Approximately 4 cm mass in the central liver, with peripheral nodular enhancement. No delayed imaging of this lesion to evaluate for centripetal fill-in. No distortion of the neighboring vascular structures. In the absence of chronic liver disease or malignancy, and given similar sized finding on sonography 08/27/2013, this most consistent with a hemangioma. There are 2 additional, smaller focal lesions, located in segment 6 and in the central right hepatic lobe, measuring 12 mm or smaller. These are indeterminate due to size.  Biliary: No evidence of biliary obstruction or stone.  Pancreas: There is retroperitoneal edema around the mildly expanded tail of the pancreas. The neighboring organs do not appear primarily inflamed. There is no necrosis or fluid collection.  Spleen: Unremarkable.  Adrenals: Unremarkable.  Kidneys and ureters: No hydronephrosis or stone.  Bladder: Unremarkable.  Reproductive: Unremarkable.  Bowel: No obstruction. Normal appendix.  Retroperitoneum: Edema as above.  Peritoneum: No free fluid or gas.  Vascular: Early aortic atherosclerosis  OSSEOUS: No acute abnormalities. Calcification associated with the right S1 nerve root (in the foramen close) , favor post inflammatory.  IMPRESSION: 1. Acute pancreatitis, involving the tail. No necrosis or loculated fluid. 2. 4 cm hepatic mass, appearance most consistent with hemangioma.   Electronically Signed   By: Jorje Guild M.D.   On: 02/25/2014 05:00    Assessment/Plan Principal Problem:   Acute  pancreatitis Active Problems:   HTN (hypertension)   Diabetes mellitus   1. Acute pancreatitis CT abdomen suggest acute pancreatitis of tail, no gallstone or biliary stricture. He denies any alcohol abuse. We'll check alcohol level, triglyceride level NPO, IV fluids, IV Zofran IV Protonix IV Dilaudid as needed for pain  2. Diabetes mellitus Sliding scale holding metformin and insulin  3.Hypertension Blood pressure stable, holding lisinopril   DVT Prophylaxis: subcutaneous Heparin Nutrition: N.p.o.  Code Status: Full  Disposition: Admitted to inpatient in med-surge unit.  Author: Berle Mull, MD [facility] Pager: [phone] 02/25/2014, 6:08 AM    If 7PM-7AM, please contact night-coverage [email or web address] Password [email or web address]

## 2014-02-25 NOTE — ED Notes (Signed)
CBG is 251. Pt states that this is a good level for him, and that it is usually higher.

## 2014-02-26 DIAGNOSIS — I10 Essential (primary) hypertension: Secondary | ICD-10-CM

## 2014-02-26 DIAGNOSIS — E119 Type 2 diabetes mellitus without complications: Secondary | ICD-10-CM

## 2014-02-26 DIAGNOSIS — K859 Acute pancreatitis without necrosis or infection, unspecified: Principal | ICD-10-CM

## 2014-02-26 LAB — GLUCOSE, CAPILLARY: GLUCOSE-CAPILLARY: 271 mg/dL — AB (ref 70–99)

## 2014-02-26 MED ORDER — GEMFIBROZIL 600 MG PO TABS: 600.0000 mg | ORAL_TABLET | Freq: Two times a day (BID) | ORAL | Status: DC

## 2014-02-26 MED ORDER — LISINOPRIL 10 MG PO TABS: 10.0000 mg | ORAL_TABLET | Freq: Every day | ORAL | Status: DC

## 2014-02-26 MED ORDER — HYDROCODONE-ACETAMINOPHEN 5-325 MG PO TABS
1.0000 | ORAL_TABLET | ORAL | Status: DC | PRN
Start: 2014-02-26 — End: 2020-11-21

## 2014-02-26 MED ORDER — METFORMIN HCL 500 MG PO TABS: 1000.0000 mg | ORAL_TABLET | Freq: Two times a day (BID) | ORAL | Status: DC

## 2014-02-26 NOTE — Care Management Note (Addendum)
    Page 1 of 1   02/26/2014     5:11:05 PM   CARE MANAGEMENT NOTE 02/26/2014  Patient:  Erik Chung, Erik Chung   Account Number:  [id number]  Date Initiated:  02/26/2014  Documentation initiated by:  Tomi Bamberger  Subjective/Objective Assessment:   dx acute pancreatitis  admit- lives with wife.     Action/Plan:   Anticipated DC Date:  02/26/2014   Anticipated DC Plan:  [place]  CM consult      Choice offered to / List presented to:             Status of service:  Completed, signed off Medicare Important Message given?   (If response is "NO", the following Medicare IM given date fields will be blank) Date Medicare IM given:   Date Additional Medicare IM given:    Discharge Disposition:  HOME/SELF CARE  Per UR Regulation:  Reviewed for med. necessity/level of care/duration of stay  If discussed at [place] of Stay Meetings, dates discussed:    Comments:  02/26/14 [place], BSN [phone] patient for dc today, NCM gave patient a list of accepting physicians , to call to schedule an apt.

## 2014-02-26 NOTE — Progress Notes (Signed)
Utilization review completed.  

## 2014-02-26 NOTE — Discharge Summary (Signed)
Physician Discharge Summary  Erik Chung TIW:[id number] DOB: 1971-12-02 DOA: 02/24/2014  PCP: Default, Provider, MD  Admit date: 02/24/2014 Discharge date: 02/26/2014  Time spent: 34 minutes  Recommendations for Outpatient Follow-up:  1. Follow up with PCP in 6 weeks.  Discharge Diagnoses:  Principal Problem:   Acute pancreatitis Active Problems:   HTN (hypertension)   Diabetes mellitus   Discharge Condition: stable  Diet recommendation: carb modified  Filed Weights   02/26/14 0539  Weight: 95.4 kg (210 lb 5.1 oz)    History of present illness:  43 y.o. male with Past medical history of diabetes mellitus, hypertension.  The patient is coming from home.  The pt presented with complain of abdominal pain that has been on going since last 3 days. Pain is located in the left abdomen and has been associated with nausea but no vomiting. He woke up with this pain 3 nights ago.  He denies any similar pain before and denies any diarrhea or constipation.  Pt denies any fever, chills, headache, cough, chest pain, palpitation, shortness of breath, orthopnea, PND, active bleeding, burning urination, dizziness, pedal edema, focal neurological deficit.    Hospital Course:  Acute pancreatitis:  - started IV fluids, narcotics for pain.  - tolerated diet. - TG's 1475. Started gemfibrosil.  Diabetes mellitus  - start levemir and SSI.  - change to 70/30  HTN (hypertension)  - bp stable. - cont lisinopril  Procedures:  CT abd  Consultations:  noen  Discharge Exam: Filed Vitals:   02/26/14 0539  BP: 129/73  Pulse: 85  Temp: 98.4 F (36.9 C)  Resp: 18    General: A&O x3 Cardiovascular: RRR Respiratory: good air movement CTA B/L  Discharge Instructions      Discharge Orders   Future Orders Complete By Expires   Diet - low sodium heart healthy  As directed    Increase activity slowly  As directed        Medication List         gemfibrozil 600 MG tablet   Commonly known as:  LOPID  Take 1 tablet (600 mg total) by mouth 2 (two) times daily before a meal.     insulin aspart protamine- aspart (70-30) 100 UNIT/ML injection  Commonly known as:  NOVOLOG MIX 70/30  Inject 30-40 Units into the skin See admin instructions. Take 40 units in the morning and 30 units at bedtime     lisinopril 10 MG tablet  Commonly known as:  PRINIVIL,ZESTRIL  Take 10 mg by mouth daily.     metFORMIN 500 MG tablet  Commonly known as:  GLUCOPHAGE  Take 500 mg by mouth daily with breakfast.       Allergies  Allergen Reactions  . Shellfish Allergy Anaphylaxis  . Penicillins Nausea And Vomiting      The results of significant diagnostics from this hospitalization (including imaging, microbiology, ancillary and laboratory) are listed below for reference.    Significant Diagnostic Studies: Ct Abdomen Pelvis W Contrast  02/25/2014   CLINICAL DATA:  Abdominal pain.  EXAM: CT ABDOMEN AND PELVIS WITH CONTRAST  TECHNIQUE: Multidetector CT imaging of the abdomen and pelvis was performed using the standard protocol following bolus administration of intravenous contrast.  CONTRAST:  169mL OMNIPAQUE IOHEXOL 300 MG/ML  SOLN  COMPARISON:  None.  FINDINGS: BODY WALL: Small fatty umbilical hernia  LOWER CHEST: Unremarkable.  ABDOMEN/PELVIS:  Liver: Approximately 4 cm mass in the central liver, with peripheral nodular enhancement. No delayed imaging of this lesion  to evaluate for centripetal fill-in. No distortion of the neighboring vascular structures. In the absence of chronic liver disease or malignancy, and given similar sized finding on sonography 08/27/2013, this most consistent with a hemangioma. There are 2 additional, smaller focal lesions, located in segment 6 and in the central right hepatic lobe, measuring 12 mm or smaller. These are indeterminate due to size.  Biliary: No evidence of biliary obstruction or stone.  Pancreas: There is retroperitoneal edema around the mildly  expanded tail of the pancreas. The neighboring organs do not appear primarily inflamed. There is no necrosis or fluid collection.  Spleen: Unremarkable.  Adrenals: Unremarkable.  Kidneys and ureters: No hydronephrosis or stone.  Bladder: Unremarkable.  Reproductive: Unremarkable.  Bowel: No obstruction. Normal appendix.  Retroperitoneum: Edema as above.  Peritoneum: No free fluid or gas.  Vascular: Early aortic atherosclerosis  OSSEOUS: No acute abnormalities. Calcification associated with the right S1 nerve root (in the foramen close) , favor post inflammatory.  IMPRESSION: 1. Acute pancreatitis, involving the tail. No necrosis or loculated fluid. 2. 4 cm hepatic mass, appearance most consistent with hemangioma.   Electronically Signed   By: Jorje Guild M.D.   On: 02/25/2014 05:00    Microbiology: No results found for this or any previous visit (from the past 240 hour(s)).   Labs: Basic Metabolic Panel:  Recent Labs Lab 02/25/14 0325  NA 133*  K 4.7  CL 103  GLUCOSE 252*  BUN 7  CREATININE 0.60   Liver Function Tests:  Recent Labs Lab 02/25/14 0315  AST 5  ALT 5  ALKPHOS 53  BILITOT 0.4  PROT 0.4*  ALBUMIN 3.4*    Recent Labs Lab 02/25/14 0315  LIPASE 48   No results found for this basename: AMMONIA,  in the last 168 hours CBC:  Recent Labs Lab 02/24/14 2208 02/25/14 0325  WBC 10.2  --   NEUTROABS 7.6  --   HGB 16.3 16.7  HCT 44.1 49.0  MCV 82.4  --   PLT 283  --    Cardiac Enzymes: No results found for this basename: CKTOTAL, CKMB, CKMBINDEX, TROPONINI,  in the last 168 hours BNP: BNP (last 3 results) No results found for this basename: PROBNP,  in the last 8760 hours CBG:  Recent Labs Lab 02/25/14 0213 02/25/14 1203 02/25/14 1717 02/25/14 2120 02/26/14 0744  GLUCAP 251* 283* 215* 312* 271*       Signed:  Charlynne Cousins  [facility] 02/26/2014, 9:24 AM

## 2015-12-08 ENCOUNTER — Other Ambulatory Visit (HOSPITAL_COMMUNITY): Payer: Self-pay | Admitting: Nurse Practitioner

## 2015-12-08 DIAGNOSIS — B182 Chronic viral hepatitis C: Secondary | ICD-10-CM

## 2016-01-07 ENCOUNTER — Ambulatory Visit (HOSPITAL_COMMUNITY)
Admission: RE | Admit: 2016-01-07 | Discharge: 2016-01-07 | Disposition: A | Discharge status: Home / Self Care | Payer: 59 | Source: Ambulatory Visit | Attending: Nurse Practitioner | Admitting: Nurse Practitioner

## 2016-01-07 DIAGNOSIS — B182 Chronic viral hepatitis C: Secondary | ICD-10-CM

## 2016-01-22 ENCOUNTER — Ambulatory Visit (HOSPITAL_COMMUNITY)
Admission: RE | Admit: 2016-01-22 | Discharge: 2016-01-22 | Disposition: A | Discharge status: Home / Self Care | Payer: 59 | Source: Ambulatory Visit | Attending: Nurse Practitioner | Admitting: Nurse Practitioner

## 2016-01-22 DIAGNOSIS — D1803 Hemangioma of intra-abdominal structures: Secondary | ICD-10-CM | POA: Diagnosis not present

## 2016-01-22 DIAGNOSIS — B182 Chronic viral hepatitis C: Secondary | ICD-10-CM | POA: Diagnosis present

## 2016-01-22 DIAGNOSIS — K769 Liver disease, unspecified: Secondary | ICD-10-CM | POA: Insufficient documentation

## 2016-01-22 DIAGNOSIS — K760 Fatty (change of) liver, not elsewhere classified: Secondary | ICD-10-CM | POA: Insufficient documentation

## 2016-01-22 LAB — GLUCOSE, CAPILLARY: Glucose-Capillary: 57 mg/dL — ABNORMAL LOW (ref 65–99)

## 2016-01-22 NOTE — Progress Notes (Signed)
Pt here for outpt ultrasound. Asking for Blood sugar to be checked. States he is diabetic and has not eaten since midnight. Blood sugar checked, 57. Sprite and crackers given. Pt stated he felt better and was ready to go. Walked pt out to waiting room. Awake and alert. In no distress.

## 2016-06-25 ENCOUNTER — Other Ambulatory Visit: Payer: Self-pay | Admitting: Nurse Practitioner

## 2016-06-25 DIAGNOSIS — K7460 Unspecified cirrhosis of liver: Secondary | ICD-10-CM

## 2016-07-06 ENCOUNTER — Ambulatory Visit
Admission: RE | Admit: 2016-07-06 | Discharge: 2016-07-06 | Disposition: A | Discharge status: Home / Self Care | Payer: 59 | Source: Ambulatory Visit | Attending: Nurse Practitioner | Admitting: Nurse Practitioner

## 2016-07-06 DIAGNOSIS — K7460 Unspecified cirrhosis of liver: Secondary | ICD-10-CM

## 2016-10-21 ENCOUNTER — Encounter (HOSPITAL_COMMUNITY): Payer: Self-pay | Admitting: Emergency Medicine

## 2016-10-21 ENCOUNTER — Emergency Department (HOSPITAL_COMMUNITY)
Admission: EM | Admit: 2016-10-21 | Discharge: 2016-10-21 | Disposition: A | Discharge status: Home / Self Care | Payer: 59 | Attending: Emergency Medicine | Admitting: Emergency Medicine

## 2016-10-21 ENCOUNTER — Emergency Department (HOSPITAL_COMMUNITY): Payer: 59

## 2016-10-21 DIAGNOSIS — Z79899 Other long term (current) drug therapy: Secondary | ICD-10-CM | POA: Insufficient documentation

## 2016-10-21 DIAGNOSIS — I10 Essential (primary) hypertension: Secondary | ICD-10-CM | POA: Diagnosis not present

## 2016-10-21 DIAGNOSIS — Z794 Long term (current) use of insulin: Secondary | ICD-10-CM | POA: Insufficient documentation

## 2016-10-21 DIAGNOSIS — F17210 Nicotine dependence, cigarettes, uncomplicated: Secondary | ICD-10-CM | POA: Insufficient documentation

## 2016-10-21 DIAGNOSIS — J069 Acute upper respiratory infection, unspecified: Secondary | ICD-10-CM | POA: Insufficient documentation

## 2016-10-21 DIAGNOSIS — E119 Type 2 diabetes mellitus without complications: Secondary | ICD-10-CM | POA: Insufficient documentation

## 2016-10-21 DIAGNOSIS — R05 Cough: Secondary | ICD-10-CM | POA: Diagnosis present

## 2016-10-21 DIAGNOSIS — Z7984 Long term (current) use of oral hypoglycemic drugs: Secondary | ICD-10-CM | POA: Diagnosis not present

## 2016-10-21 DIAGNOSIS — R059 Cough, unspecified: Secondary | ICD-10-CM

## 2016-10-21 MED ORDER — BENZONATATE 100 MG PO CAPS: 100.0000 mg | ORAL_CAPSULE | Freq: Three times a day (TID) | ORAL | 0 refills | Status: DC | PRN

## 2016-10-21 NOTE — ED Triage Notes (Signed)
Pt states that he has been having nasal congestion, productive cough, sore throat, headache x 10 days.

## 2016-10-21 NOTE — ED Provider Notes (Signed)
[facility] DEPT Provider Note   CSN: [id number] Arrival date & time: 10/21/16  0732     History   Chief Complaint Chief Complaint  Patient presents with  . Nasal Congestion  . Cough    HPI Erik Chung is a 45 y.o. male.  The history is provided by the patient and medical records. No language interpreter was used.  Cough  Associated symptoms include sore throat and myalgias. Pertinent negatives include no chest pain, no chills and no shortness of breath.   Erik Chung is a 45 y.o. male  with a PMH of HTN, DM who presents to the Emergency Department complaining of worsening sore throat, nasal congestion and productive cough x 10 days. Intermittent body aches as well. Theraflu with no relief. Cough drops have been helpful for cough suppression. Endorses multiple sick contacts at work with similar sxs. Denies fever/chills, shortness of breath, chest pain, back pain, neck pain, difficulty swallowing.   Past Medical History:  Diagnosis Date  . Diabetes mellitus   . Hypertension   . No pertinent past medical history     Patient Active Problem List   Diagnosis Date Noted  . Acute pancreatitis 02/25/2014  . Diabetes mellitus ([place]) 02/25/2014  . HTN (hypertension) 01/18/2012  . Hypokalemia 01/18/2012  . Dehydration 01/18/2012  . Diabetes mellitus, new onset ([place]) 01/16/2012  . Diabetes with ketoacidosis 01/16/2012    History reviewed. No pertinent surgical history.     Home Medications    Prior to Admission medications   Medication Sig Start Date End Date Taking? Authorizing Provider  benzonatate (TESSALON) 100 MG capsule Take 1 capsule (100 mg total) by mouth 3 (three) times daily as needed for cough. 10/21/16   Judene Logue Pilcher Tannen Vandezande, PA-C  gemfibrozil (LOPID) 600 MG tablet Take 1 tablet (600 mg total) by mouth 2 (two) times daily before a meal. 02/26/14   Charlynne Cousins, MD  HYDROcodone-acetaminophen (NORCO/VICODIN) 5-325 MG per tablet Take 1 tablet by mouth every  4 (four) hours as needed for moderate pain. 02/26/14   Charlynne Cousins, MD  insulin aspart protamine- aspart (NOVOLOG MIX 70/30) (70-30) 100 UNIT/ML injection Inject 30-40 Units into the skin See admin instructions. Take 40 units in the morning and 30 units at bedtime    Historical Provider, MD  lisinopril (PRINIVIL,ZESTRIL) 10 MG tablet Take 1 tablet (10 mg total) by mouth daily. 02/26/14   Charlynne Cousins, MD  metFORMIN (GLUCOPHAGE) 500 MG tablet Take 2 tablets (1,000 mg total) by mouth 2 (two) times daily with a meal. 02/26/14   Charlynne Cousins, MD    Family History Family History  Problem Relation Age of Onset  . Diabetes Mother   . Diabetes Father     Social History Social History  Substance Use Topics  . Smoking status: Current Every Day Smoker    Packs/day: 1.00    Types: Cigarettes  . Smokeless tobacco: Never Used  . Alcohol use 1.8 oz/week    3 Cans of beer per week     Allergies   Shellfish allergy and Penicillins   Review of Systems Review of Systems  Constitutional: Negative for chills and fever.  HENT: Positive for congestion and sore throat. Negative for trouble swallowing.   Respiratory: Positive for cough. Negative for shortness of breath.   Cardiovascular: Negative for chest pain.  Gastrointestinal: Negative for abdominal pain.  Musculoskeletal: Positive for myalgias. Negative for back pain.     Physical Exam Updated Vital Signs BP 165/82 (BP Location:  Left Arm)   Pulse 86   Temp 98.5 F (36.9 C) (Oral)   Resp 16   SpO2 100%   Physical Exam  Constitutional: He is oriented to person, place, and time. He appears well-developed and well-nourished. No distress.  HENT:  Head: Normocephalic and atraumatic.  OP with erythema, no exudates or tonsillar hypertrophy. + PND. + nasal congestion with mucosal edema.   Neck: Normal range of motion. Neck supple.  No meningeal signs.   Cardiovascular: Normal rate, regular rhythm and normal heart sounds.     Pulmonary/Chest: Effort normal.  Lungs are clear to auscultation bilaterally - no w/r/r  Abdominal: Soft. He exhibits no distension. There is no tenderness.  Musculoskeletal: Normal range of motion.  Neurological: He is alert and oriented to person, place, and time.  Skin: Skin is warm and dry. He is not diaphoretic.  Nursing note and vitals reviewed.    ED Treatments / Results  Labs (all labs ordered are listed, but only abnormal results are displayed) Labs Reviewed - No data to display  EKG  EKG Interpretation None       Radiology Dg Chest 2 View  Result Date: 10/21/2016 CLINICAL DATA:  Acute cough congestion with headache for 10 days. Smoker. EXAM: CHEST  2 VIEW COMPARISON:  01/16/2012 FINDINGS: The heart size and mediastinal contours are within normal limits. Both lungs are clear. The visualized skeletal structures are unremarkable. IMPRESSION: No active cardiopulmonary disease. Electronically Signed   By: Jerilynn Mages.  Shick M.D.   On: 10/21/2016 08:53    Procedures Procedures (including critical care time)  Medications Ordered in ED Medications - No data to display   Initial Impression / Assessment and Plan / ED Course  I have reviewed the triage vital signs and the nursing notes.  Pertinent labs & imaging results that were available during my care of the patient were reviewed by me and considered in my medical decision making (see chart for details).  Clinical Course   Erik Chung is afebrile, non-toxic appearing with a clear lung exam. + nasal congestion and boggy turbinates. + PND. No OP exudates. Likely viral URI. Patient is agreeable to symptomatic treatment with close follow up with PCP as needed but spoke at length about emergent, changing, or worsening of symptoms that should prompt return to ER. Hx of elevated BP but has not been taking meds. Also discussed importance of PCP follow up for BP check and taking medications as directed.  Patient voices understanding and  is agreeable to plan.  Blood pressure 165/82, pulse 86, temperature 98.5 F (36.9 C), temperature source Oral, resp. rate 16, SpO2 100 %.   Final Clinical Impressions(s) / ED Diagnoses   Final diagnoses:  Cough  Upper respiratory tract infection, unspecified type    New Prescriptions New Prescriptions   BENZONATATE (TESSALON) 100 MG CAPSULE    Take 1 capsule (100 mg total) by mouth 3 (three) times daily as needed for cough.     [facility] Erik Maye, PA-C 10/21/16 0932    Davonna Belling, MD 10/21/16 [phone]

## 2016-10-21 NOTE — Discharge Instructions (Signed)
1. Medications: flonase and mucinex for nasal congestion, tessalon for cough, continue usual home medications 2. Treatment: rest, drink plenty of fluids 3. Follow Up: Please follow up with your primary doctor for discussion of your diagnoses and further evaluation after today's visit if symptoms persist longer than 5 more days; Return to the ER for high fevers, difficulty breathing or other concerning symptoms

## 2016-12-03 ENCOUNTER — Other Ambulatory Visit: Payer: Self-pay | Admitting: Nurse Practitioner

## 2016-12-03 DIAGNOSIS — K7469 Other cirrhosis of liver: Secondary | ICD-10-CM

## 2020-11-20 ENCOUNTER — Ambulatory Visit (HOSPITAL_BASED_OUTPATIENT_CLINIC_OR_DEPARTMENT_OTHER)
Admission: EM | Admit: 2020-11-20 | Discharge: 2020-11-21 | Disposition: A | Discharge status: Home / Self Care | Payer: Self-pay | Attending: Emergency Medicine | Admitting: Emergency Medicine

## 2020-11-20 ENCOUNTER — Emergency Department (HOSPITAL_BASED_OUTPATIENT_CLINIC_OR_DEPARTMENT_OTHER): Payer: Self-pay

## 2020-11-20 ENCOUNTER — Other Ambulatory Visit: Payer: Self-pay

## 2020-11-20 ENCOUNTER — Encounter (HOSPITAL_BASED_OUTPATIENT_CLINIC_OR_DEPARTMENT_OTHER): Payer: Self-pay | Admitting: *Deleted

## 2020-11-20 ENCOUNTER — Encounter (HOSPITAL_COMMUNITY)
Admission: EM | Disposition: A | Discharge status: Home / Self Care | Payer: Self-pay | Source: Home / Self Care | Attending: Emergency Medicine

## 2020-11-20 DIAGNOSIS — L02511 Cutaneous abscess of right hand: Secondary | ICD-10-CM | POA: Insufficient documentation

## 2020-11-20 DIAGNOSIS — Z20822 Contact with and (suspected) exposure to covid-19: Secondary | ICD-10-CM | POA: Insufficient documentation

## 2020-11-20 DIAGNOSIS — Z880 Allergy status to penicillin: Secondary | ICD-10-CM | POA: Insufficient documentation

## 2020-11-20 DIAGNOSIS — M7989 Other specified soft tissue disorders: Secondary | ICD-10-CM

## 2020-11-20 DIAGNOSIS — F17210 Nicotine dependence, cigarettes, uncomplicated: Secondary | ICD-10-CM | POA: Insufficient documentation

## 2020-11-20 HISTORY — PX: INCISION AND DRAINAGE OF WOUND: SHX1803

## 2020-11-20 LAB — RESP PANEL BY RT-PCR (FLU A&B, COVID) ARPGX2
Influenza A by PCR: NEGATIVE
Influenza B by PCR: NEGATIVE
SARS Coronavirus 2 by RT PCR: NEGATIVE

## 2020-11-20 LAB — BASIC METABOLIC PANEL
Anion gap: 11 (ref 5–15)
BUN: 16 mg/dL (ref 6–20)
CO2: 25 mmol/L (ref 22–32)
Calcium: 9.4 mg/dL (ref 8.9–10.3)
Chloride: 95 mmol/L — ABNORMAL LOW (ref 98–111)
Creatinine, Ser: 0.9 mg/dL (ref 0.61–1.24)
GFR, Estimated: >60 mL/min (ref >60)
Glucose, Bld: 479 mg/dL — ABNORMAL HIGH (ref 70–99)
Potassium: 4.2 mmol/L (ref 3.5–5.1)
Sodium: 131 mmol/L — ABNORMAL LOW (ref 135–145)

## 2020-11-20 LAB — CBC WITH DIFFERENTIAL/PLATELET
Abs Immature Granulocytes: 0.08 10*3/uL — ABNORMAL HIGH (ref 0.00–0.07)
Basophils Absolute: 0 10*3/uL (ref 0.0–0.1)
Basophils Relative: 0 %
Eosinophils Absolute: 0.1 10*3/uL (ref 0.0–0.5)
Eosinophils Relative: 1 %
HCT: 46.1 % (ref 39.0–52.0)
Hemoglobin: 15 g/dL (ref 13.0–17.0)
Immature Granulocytes: 1 %
Lymphocytes Relative: 29 %
Lymphs Abs: 2.9 10*3/uL (ref 0.7–4.0)
MCH: 27.3 pg (ref 26.0–34.0)
MCHC: 32.5 g/dL (ref 30.0–36.0)
MCV: 83.8 fL (ref 80.0–100.0)
Monocytes Absolute: 0.8 10*3/uL (ref 0.1–1.0)
Monocytes Relative: 8 %
Neutro Abs: 6 10*3/uL (ref 1.7–7.7)
Neutrophils Relative %: 61 %
Platelets: 229 10*3/uL (ref 150–400)
RBC: 5.5 MIL/uL (ref 4.22–5.81)
RDW: 13.2 % (ref 11.5–15.5)
WBC: 10 10*3/uL (ref 4.0–10.5)
nRBC: 0 % (ref 0.0–0.2)

## 2020-11-20 LAB — CBG MONITORING, ED: Glucose-Capillary: 506 mg/dL (ref 70–99)

## 2020-11-20 SURGERY — IRRIGATION AND DEBRIDEMENT WOUND
Anesthesia: General | Site: Finger | Laterality: Right

## 2020-11-20 MED ORDER — INSULIN REGULAR HUMAN 100 UNIT/ML IJ SOLN
8.0000 [IU] | Freq: Once | INTRAMUSCULAR | Status: DC
  Filled 2020-11-20: qty 3

## 2020-11-20 MED ORDER — INSULIN GLARGINE 100 UNIT/ML ~~LOC~~ SOLN
40.0000 [IU] | Freq: Once | SUBCUTANEOUS | Status: AC
  Administered 2020-11-20: 40 [IU] via SUBCUTANEOUS
  Filled 2020-11-20 (×2): qty 1

## 2020-11-20 MED ORDER — BUPIVACAINE HCL (PF) 0.25 % IJ SOLN
INTRAMUSCULAR | Status: AC
  Filled 2020-11-20: qty 10

## 2020-11-20 MED ORDER — INSULIN ASPART 100 UNIT/ML ~~LOC~~ SOLN
8.0000 [IU] | SUBCUTANEOUS | Status: AC
  Administered 2020-11-20: 8 [IU] via SUBCUTANEOUS
  Filled 2020-11-20: qty 8

## 2020-11-20 MED ORDER — BUPIVACAINE HCL (PF) 0.25 % IJ SOLN
INTRAMUSCULAR | Status: AC
  Filled 2020-11-20: qty 30

## 2020-11-20 MED ORDER — PROPOFOL 10 MG/ML IV BOLUS
INTRAVENOUS | Status: AC
  Filled 2020-11-20: qty 20

## 2020-11-20 MED ORDER — FENTANYL CITRATE (PF) 250 MCG/5ML IJ SOLN
INTRAMUSCULAR | Status: AC
  Filled 2020-11-20: qty 5

## 2020-11-20 MED ORDER — SODIUM CHLORIDE 0.9 % IV BOLUS: 1000.0000 mL | Freq: Once | INTRAVENOUS | Status: DC

## 2020-11-20 SURGICAL SUPPLY — 48 items
ADAPTER CATH SYR TO TUBING 38M (ADAPTER) ×1 IMPLANT
ADPR CATH LL SYR 3/32 TPR (ADAPTER)
BNDG CMPR 9X4 STRL LF SNTH (GAUZE/BANDAGES/DRESSINGS) ×1
BNDG COHESIVE 1X5 TAN STRL LF (GAUZE/BANDAGES/DRESSINGS) ×1 IMPLANT
BNDG COHESIVE 2X5 TAN STRL LF (GAUZE/BANDAGES/DRESSINGS) IMPLANT
BNDG ELASTIC 3X5.8 VLCR STR LF (GAUZE/BANDAGES/DRESSINGS) ×2 IMPLANT
BNDG ELASTIC 4X5.8 VLCR STR LF (GAUZE/BANDAGES/DRESSINGS) ×2 IMPLANT
BNDG ESMARK 4X9 LF (GAUZE/BANDAGES/DRESSINGS) ×1 IMPLANT
BNDG GAUZE ELAST 4 BULKY (GAUZE/BANDAGES/DRESSINGS) ×2 IMPLANT
CANNULA VESSEL 3MM 2 BLNT TIP (CANNULA) ×1 IMPLANT
CORD BIPOLAR FORCEPS 12FT (ELECTRODE) ×2 IMPLANT
COVER SURGICAL LIGHT HANDLE (MISCELLANEOUS) ×2 IMPLANT
COVER WAND RF STERILE (DRAPES) ×1 IMPLANT
CUFF TOURN SGL QUICK 18X4 (TOURNIQUET CUFF) ×1 IMPLANT
DRSG PAD ABDOMINAL 8X10 ST (GAUZE/BANDAGES/DRESSINGS) ×4 IMPLANT
GAUZE SPONGE 4X4 12PLY STRL (GAUZE/BANDAGES/DRESSINGS) ×2 IMPLANT
GAUZE XEROFORM 1X8 LF (GAUZE/BANDAGES/DRESSINGS) ×2 IMPLANT
GLOVE BIO SURGEON STRL SZ7.5 (GLOVE) ×2 IMPLANT
GLOVE BIOGEL PI IND STRL 8 (GLOVE) ×1 IMPLANT
GLOVE BIOGEL PI INDICATOR 8 (GLOVE) ×1
GLOVE SURG SS PI 7.0 STRL IVOR (GLOVE) ×1 IMPLANT
GLOVE SURG UNDER POLY LF SZ7 (GLOVE) ×1 IMPLANT
GOWN STRL REUS W/ TWL LRG LVL3 (GOWN DISPOSABLE) ×1 IMPLANT
GOWN STRL REUS W/ TWL XL LVL3 (GOWN DISPOSABLE) ×1 IMPLANT
GOWN STRL REUS W/TWL LRG LVL3 (GOWN DISPOSABLE) ×2
GOWN STRL REUS W/TWL XL LVL3 (GOWN DISPOSABLE) ×2
KIT BASIN OR (CUSTOM PROCEDURE TRAY) ×2 IMPLANT
KIT TURNOVER KIT B (KITS) ×2 IMPLANT
MANIFOLD NEPTUNE II (INSTRUMENTS) ×1 IMPLANT
NDL HYPO 25X1 1.5 SAFETY (NEEDLE) IMPLANT
NEEDLE HYPO 25X1 1.5 SAFETY (NEEDLE) ×2 IMPLANT
NS IRRIG 1000ML POUR BTL (IV SOLUTION) ×2 IMPLANT
PACK ORTHO EXTREMITY (CUSTOM PROCEDURE TRAY) ×2 IMPLANT
PAD ARMBOARD 7.5X6 YLW CONV (MISCELLANEOUS) ×4 IMPLANT
SET CYSTO W/LG BORE CLAMP LF (SET/KITS/TRAYS/PACK) ×1 IMPLANT
SPLINT FINGER 2.25 911902 (SOFTGOODS) ×1 IMPLANT
SPONGE LAP 4X18 RFD (DISPOSABLE) ×2 IMPLANT
SUT ETHILON 4 0 P 3 18 (SUTURE) IMPLANT
SUT ETHILON 4 0 PS 2 18 (SUTURE) IMPLANT
SUT MON AB 5-0 P3 18 (SUTURE) IMPLANT
SWAB COLLECTION DEVICE MRSA (MISCELLANEOUS) ×1 IMPLANT
SWAB CULTURE ESWAB REG 1ML (MISCELLANEOUS) ×1 IMPLANT
SYR 20ML LL LF (SYRINGE) ×2 IMPLANT
SYR CONTROL 10ML LL (SYRINGE) ×1 IMPLANT
TOWEL GREEN STERILE (TOWEL DISPOSABLE) ×2 IMPLANT
TUBE CONNECTING 12X1/4 (SUCTIONS) ×2 IMPLANT
UNDERPAD 30X36 HEAVY ABSORB (UNDERPADS AND DIAPERS) ×2 IMPLANT
YANKAUER SUCT BULB TIP NO VENT (SUCTIONS) ×2 IMPLANT

## 2020-11-20 NOTE — H&P (Signed)
Erik Chung is an 49 y.o. male.   Chief Complaint: right long finger infection HPI: 49 yo male states he burned right long finger on grill ~2 weeks ago at work.  Had been doing well until a few days ago when finger began to swell at PIP joint and become more painful.  This has progressively worsened.  No fevers, chills, sweats.  Pain radiates into back of hand with burning sensation.  Case discussed with Pati Gallo, PA and his note from 11/20/2020 reviewed. Xrays viewed and interpreted by me: ap lateral oblique views right long finger show no fractures, dislocations, radioopaque foreign bodies.  Labs reviewed: WBC 10.0  Allergies:  Allergies  Allergen Reactions  . Shellfish Allergy Anaphylaxis  . Penicillins Nausea And Vomiting    Past Medical History:  Diagnosis Date  . Diabetes mellitus   . Hypertension   . No pertinent past medical history     History reviewed. No pertinent surgical history.  Family History: Family History  Problem Relation Age of Onset  . Diabetes Mother   . Diabetes Father     Social History:   reports that he has been smoking cigarettes. He has been smoking about 1.00 pack per day. He has never used smokeless tobacco. He reports current alcohol use of about 3.0 standard drinks of alcohol per week. He reports current drug use. Drugs: Marijuana and Cocaine.  Medications: (Not in a hospital admission)   Results for orders placed or performed during the hospital encounter of 11/20/20 (from the past 48 hour(s))  POC CBG, ED     Status: Abnormal   Collection Time: 11/20/20  8:47 PM  Result Value Ref Range   Glucose-Capillary 506 (HH) 70 - 99 mg/dL    Comment: Glucose reference range applies only to samples taken after fasting for at least 8 hours.  CBC with Differential/Platelet     Status: Abnormal   Collection Time: 11/20/20  8:54 PM  Result Value Ref Range   WBC 10.0 4.0 - 10.5 K/uL   RBC 5.50 4.22 - 5.81 MIL/uL   Hemoglobin 15.0 13.0 - 17.0  g/dL   HCT 46.1 39 - 52 %   MCV 83.8 80.0 - 100.0 fL   MCH 27.3 26.0 - 34.0 pg   MCHC 32.5 30.0 - 36.0 g/dL   RDW 13.2 11.5 - 15.5 %   Platelets 229 150 - 400 K/uL   nRBC 0.0 0.0 - 0.2 %   Neutrophils Relative % 61 %   Neutro Abs 6.0 1.7 - 7.7 K/uL   Lymphocytes Relative 29 %   Lymphs Abs 2.9 0.7 - 4.0 K/uL   Monocytes Relative 8 %   Monocytes Absolute 0.8 0.1 - 1.0 K/uL   Eosinophils Relative 1 %   Eosinophils Absolute 0.1 0.0 - 0.5 K/uL   Basophils Relative 0 %   Basophils Absolute 0.0 0.0 - 0.1 K/uL   Immature Granulocytes 1 %   Abs Immature Granulocytes 0.08 (H) 0.00 - 0.07 K/uL    Comment: Performed at [facility], [address] [place], [place] [address]  Basic metabolic panel     Status: Abnormal   Collection Time: 11/20/20  8:54 PM  Result Value Ref Range   Sodium 131 (L) 135 - 145 mmol/L   Potassium 4.2 3.5 - 5.1 mmol/L   Chloride 95 (L) 98 - 111 mmol/L   CO2 25 22 - 32 mmol/L   Glucose, Bld 479 (H) 70 - 99 mg/dL  Comment: Glucose reference range applies only to samples taken after fasting for at least 8 hours.   BUN 16 6 - 20 mg/dL   Creatinine, Ser 0.90 0.61 - 1.24 mg/dL   Calcium 9.4 8.9 - 10.3 mg/dL   GFR, Estimated >60 >60 mL/min    Comment: (NOTE) Calculated using the CKD-EPI Creatinine Equation (2021)    Anion gap 11 5 - 15    Comment: Performed at [facility], [address] [place], [place] [address]  Resp Panel by RT-PCR (Flu A&B, Covid) Nasopharyngeal Swab     Status: None   Collection Time: 11/20/20  9:54 PM   Specimen: Nasopharyngeal Swab; Nasopharyngeal(NP) swabs in vial transport medium  Result Value Ref Range   SARS Coronavirus 2 by RT PCR NEGATIVE NEGATIVE    Comment: (NOTE) SARS-CoV-2 target nucleic acids are NOT DETECTED.  The SARS-CoV-2 RNA is generally detectable in upper respiratory specimens during the acute phase of infection. The lowest concentration of SARS-CoV-2 viral copies this assay can detect is 138  copies/mL. A negative result does not preclude SARS-Cov-2 infection and should not be used as the sole basis for treatment or other patient management decisions. A negative result may occur with  improper specimen collection/handling, submission of specimen other than nasopharyngeal swab, presence of viral mutation(s) within the areas targeted by this assay, and inadequate number of viral copies(<138 copies/mL). A negative result must be combined with clinical observations, patient history, and epidemiological information. The expected result is Negative.  Fact Sheet for Patients:  EntrepreneurPulse.com.au  Fact Sheet for Healthcare Providers:  IncredibleEmployment.be  This test is no t yet approved or cleared by the [place] FDA and  has been authorized for detection and/or diagnosis of SARS-CoV-2 by FDA under an Emergency Use Authorization (EUA). This EUA will remain  in effect (meaning this test can be used) for the duration of the COVID-19 declaration under Section 564(b)(1) of the Act, 21 U.S.C.section 360bbb-3(b)(1), unless the authorization is terminated  or revoked sooner.       Influenza A by PCR NEGATIVE NEGATIVE   Influenza B by PCR NEGATIVE NEGATIVE    Comment: (NOTE) The Xpert Xpress SARS-CoV-2/FLU/RSV plus assay is intended as an aid in the diagnosis of influenza from Nasopharyngeal swab specimens and should not be used as a sole basis for treatment. Nasal washings and aspirates are unacceptable for Xpert Xpress SARS-CoV-2/FLU/RSV testing.  Fact Sheet for Patients: EntrepreneurPulse.com.au  Fact Sheet for Healthcare Providers: IncredibleEmployment.be  This test is not yet approved or cleared by the [place] FDA and has been authorized for detection and/or diagnosis of SARS-CoV-2 by FDA under an Emergency Use Authorization (EUA). This EUA will remain in effect (meaning this test  can be used) for the duration of the COVID-19 declaration under Section 564(b)(1) of the Act, 21 U.S.C. section 360bbb-3(b)(1), unless the authorization is terminated or revoked.  Performed at [facility], [address] [place], [place] [address]     DG Finger Middle Right  Result Date: 11/20/2020 CLINICAL DATA:  49 year old who burn the RIGHT long finger approximately 2 weeks ago on a grill and presents with worsening pain and swelling. Initial encounter. EXAM: RIGHT MIDDLE FINGER 2+V COMPARISON:  None. FINDINGS: Soft tissue swelling overlying the PIP joint with associated gas bubbles. No underlying osseous abnormality. No evidence of acute or subacute fracture or dislocation. Well-preserved joint spaces. Well-preserved bone mineral density. IMPRESSION: 1. No osseous abnormality. 2. Soft tissue swelling overlying the PIP  joint with associated gas bubbles indicating infection. Electronically Signed   By: Evangeline Dakin M.D.   On: 11/20/2020 19:02     A comprehensive review of systems was negative. Review of Systems: No fevers, chills, night sweats, chest pain, shortness of breath, nausea, vomiting, diarrhea, constipation, easy bleeding or bruising, headaches, dizziness, vision changes, fainting.   Blood pressure (!) 167/86, pulse 97, temperature 99.6 F (37.6 C), temperature source Oral, resp. rate 15, height 5\' 11"  (1.803 m), weight 100.5 kg, SpO2 95 %.  General appearance: alert, cooperative and appears stated age Head: Normocephalic, without obvious abnormality, atraumatic Neck: supple, symmetrical, trachea midline Resp: clear to auscultation bilaterally Cardio: regular rate and rhythm Extremities: Intact sensation and capillary refill all digits.  +epl/fpl/io.  Right long finger swollen at pip joint.  Scabbed over wound dorsally.  Tender to palpation dorsally at pip joint and proximally.  No tenderness palmarly.  Pain with motion of pip joint. Pulses: 2+ and  symmetric Skin: Skin color, texture, turgor normal. No rashes or lesions Neurologic: Grossly normal Incision/Wound: as above  Assessment/Plan Right long finger pip joint infection.  Recommend OR for incision and drainage.  Risks, benefits and alternatives of surgery were discussed including risks of blood loss, infection, damage to nerves/vessels/tendons/ligament/bone, failure of surgery, need for additional surgery, complication with wound healing, stiffness, need for repeat irrigation and debridement.  He voiced understanding of these risks and elected to proceed.    Leanora Cover 11/20/2020, 11:56 PM

## 2020-11-20 NOTE — ED Provider Notes (Signed)
  10:57 PM Patient transferred here from [facility] for hand surgery evaluation for infection in right middle finger.  States it started as a small burn and he "fixed it himself".  States worse over the past week with swelling, now feels like his hand is on fire.  He remains HD stable.  Secretary to page Dr. Fredna Dow and notify of arrival in ED.  Dr. Fredna Dow has taken patient to OR.   Larene Pickett, PA-C 11/20/20 2300    Sherwood Gambler, MD 11/21/20 1147

## 2020-11-20 NOTE — ED Provider Notes (Signed)
[facility] Provider Note   CSN: [id number] Arrival date & time: 11/20/20  1823     History Chief Complaint  Patient presents with   Finger Injury    Erik Chung is a 49 y.o. male.  HPI Patient is a 49 year old male with past medical history of DM not currently on medications although he is prescribed Metformin as well as long-acting and short acting insulin he states he been off his medications for couple months because of lack of health insurance which starts in January.  Patient states that he burned his right middle finger right at the PIP joint 2 weeks ago on a grill at work.  He states that since that time it is swollen up become more painful and states that it is difficult to bend without some discomfort although he can still bend the finger.  He denies any other trauma or injuries to the finger.  He denies any fevers, chills, nausea or vomiting.  Denies any weakness or fatigue or malaise. He states the symptoms have been significantly worse over the past 2 days.  No other significant symptoms today.  He denies any mitigating factors he has not taken any pain medication at all.  He does state that is worse when he bends his finger move his finger.  No other associated symptoms.    Past Medical History:  Diagnosis Date   Diabetes mellitus    Hypertension    No pertinent past medical history     Patient Active Problem List   Diagnosis Date Noted   Acute pancreatitis 02/25/2014   Diabetes mellitus ([place]) 02/25/2014   HTN (hypertension) 01/18/2012   Hypokalemia 01/18/2012   Dehydration 01/18/2012   Diabetes mellitus, new onset ([place]) 01/16/2012   Diabetes with ketoacidosis 01/16/2012    History reviewed. No pertinent surgical history.     Family History  Problem Relation Age of Onset   Diabetes Mother    Diabetes Father     Social History   Tobacco Use   Smoking status: Current Every Day Smoker    Packs/day:  1.00    Types: Cigarettes   Smokeless tobacco: Never Used  Substance Use Topics   Alcohol use: Yes    Alcohol/week: 3.0 standard drinks    Types: 3 Cans of beer per week   Drug use: Yes    Types: Marijuana, Cocaine    Home Medications Prior to Admission medications   Medication Sig Start Date End Date Taking? Authorizing Provider  insulin glargine (LANTUS) 100 UNIT/ML injection Inject into the skin.   Yes [provider]  insulin lispro (HUMALOG) 100 UNIT/ML KwikPen Inject into the skin. 07/08/20  Yes [provider]  benzonatate (TESSALON) 100 MG capsule Take 1 capsule (100 mg total) by mouth 3 (three) times daily as needed for cough. 10/21/16   Ward, Ozella Almond, PA-C  gemfibrozil (LOPID) 600 MG tablet Take 1 tablet (600 mg total) by mouth 2 (two) times daily before a meal. 02/26/14   Charlynne Cousins, MD  HYDROcodone-acetaminophen (NORCO/VICODIN) 5-325 MG per tablet Take 1 tablet by mouth every 4 (four) hours as needed for moderate pain. 02/26/14   Charlynne Cousins, MD  insulin aspart protamine- aspart (NOVOLOG MIX 70/30) (70-30) 100 UNIT/ML injection Inject 30-40 Units into the skin See admin instructions. Take 40 units in the morning and 30 units at bedtime    [provider]  lisinopril (PRINIVIL,ZESTRIL) 10 MG tablet Take 1 tablet (10 mg total) by mouth daily.  02/26/14   Charlynne Cousins, MD  metFORMIN (GLUCOPHAGE) 500 MG tablet Take 2 tablets (1,000 mg total) by mouth 2 (two) times daily with a meal. 02/26/14   Charlynne Cousins, MD    Allergies    Shellfish allergy and Penicillins  Review of Systems   Review of Systems  Constitutional: Negative for chills and fever.  HENT: Negative for congestion.   Respiratory: Negative for shortness of breath.   Cardiovascular: Negative for chest pain.  Gastrointestinal: Negative for abdominal pain.  Musculoskeletal: Negative for neck pain.       Right middle finger pain    Physical Exam Updated  Vital Signs BP (!) 167/85 (BP Location: Left Arm)    Pulse (!) 101    Temp 99.7 F (37.6 C) (Oral)    Resp 14    Ht 5\' 11"  (1.803 m)    Wt 100.5 kg    SpO2 99%    BMI 30.89 kg/m   CONSTITUTIONAL:  well-appearing, NAD NEURO:  Alert and oriented x 3, no focal deficits EYES:  pupils equal and reactive ENT/NECK:  trachea midline, no JVD CARDIO:  reg rate, reg rhythm, well-perfused PULM:  None labored breathing GI/GU:  Abdomen non-distended MSK/SPINE:  No gross deformities, no edema Tenderness to palpation over the flexor surface of the volar aspect of the proximal phalanx.  There is fusiform swelling around the proximal finger proximal to the PIP.  Significant swelling of the PIP joint with questionable fluctuance over the joint space.  There is a 1 cm ulcerated area skin may be sequela from a burn. Skin is held in slight flexion and there is significant discomfort with passive extension of the finger.  Patient is able to flex and extend the DIP without any pain.  There is no tenderness at the DIP or distal.  SKIN:  no rash obvious, atraumatic, no ecchymosis  PSYCH:  Appropriate speech and behavior     ED Results / Procedures / Treatments   Labs (all labs ordered are listed, but only abnormal results are displayed) Labs Reviewed  CBC WITH DIFFERENTIAL/PLATELET - Abnormal; Notable for the following components:      Result Value   Abs Immature Granulocytes 0.08 (*)    All other components within normal limits  BASIC METABOLIC PANEL - Abnormal; Notable for the following components:   Sodium 131 (*)    Chloride 95 (*)    Glucose, Bld 479 (*)    All other components within normal limits  CBG MONITORING, ED - Abnormal; Notable for the following components:   Glucose-Capillary 506 (*)    All other components within normal limits  RESP PANEL BY RT-PCR (FLU A&B, COVID) [id number]    EKG None  Radiology DG Finger Middle Right  Result Date: 11/20/2020 CLINICAL DATA:  49 year old who burn  the RIGHT long finger approximately 2 weeks ago on a grill and presents with worsening pain and swelling. Initial encounter. EXAM: RIGHT MIDDLE FINGER 2+V COMPARISON:  None. FINDINGS: Soft tissue swelling overlying the PIP joint with associated gas bubbles. No underlying osseous abnormality. No evidence of acute or subacute fracture or dislocation. Well-preserved joint spaces. Well-preserved bone mineral density. IMPRESSION: 1. No osseous abnormality. 2. Soft tissue swelling overlying the PIP joint with associated gas bubbles indicating infection. Electronically Signed   By: Evangeline Dakin M.D.   On: 11/20/2020 19:02    Procedures Procedures (including critical care time)  Medications Ordered in ED Medications  sodium chloride 0.9 % bolus 1,000 mL (0  mLs Intravenous Hold 11/20/20 2157)  insulin aspart (novoLOG) injection 8 Units (8 Units Subcutaneous Given 11/20/20 2151)  insulin glargine (LANTUS) injection 40 Units (40 Units Subcutaneous Given 11/20/20 2152)    ED Course  I have reviewed the triage vital signs and the nursing notes.  Pertinent labs & imaging results that were available during my care of the patient were reviewed by me and considered in my medical decision making (see chart for details).  Patient is 49 year old male past medical history of poorly controlled diabetes on no medication currently is prescribed long short acting insulin.  He is presenting today for right middle finger pain and swelling and tenderness is been worsening since 2 weeks ago when he burned his finger as well  On physical exam he has significant tenderness over the PIP as well as fusiform swelling around the proximal phalanx, he is mildly tachycardic likely secondary to dehydration and elevated blood sugar.  His CBG is 500.  His finger is held and  slight flexion and he has discomfort with extension.  Given concern for flexor tenosynovitis discussed my attending physician assessed patient at  bedside.  Clinical Course as of Nov 20 2158  Thu Nov 20, 2020  2140 Discussed with Dr. Fredna Dow of hand surgery who requested patient be transferred to [facility] .  Will be ER to ER transfer.   [WF]  2155 Discussed with Dr. Sabra Heck who will accept ER to ER transfer.   [WF]  2155 Received insulin will receive 1 L normal saline when he arrives at [facility], ER.  Patient will be transferred by POV.   [WF]  2155 Patient understands the importance of n.p.o. status.  This was confirmed multiple times and he was able to teach back n.p.o. status.   [WF]    Clinical Course User Index [WF] Tedd Sias, [place]   Patient given long and short acting insulin CBG will be rechecked at excepting ER and 1 L normal saline ordered for patient receiving facility.  MDM Rules/Calculators/A&P                          Final Clinical Impression(s) / ED Diagnoses Final diagnoses:  Finger swelling    Rx / DC Orders ED Discharge Orders    None       Tedd Sias, [place] 11/20/20 2200    Little, Wenda Overland, MD 11/24/20 1452

## 2020-11-20 NOTE — ED Triage Notes (Signed)
Right middle finger is painful and swollen. He thinks he could have sustained a burn to his finger a few weeks ago.

## 2020-11-21 ENCOUNTER — Encounter (HOSPITAL_COMMUNITY): Payer: Self-pay | Admitting: Orthopedic Surgery

## 2020-11-21 ENCOUNTER — Emergency Department (HOSPITAL_COMMUNITY): Payer: Self-pay | Admitting: Certified Registered"

## 2020-11-21 LAB — GLUCOSE, CAPILLARY
Glucose-Capillary: 321 mg/dL — ABNORMAL HIGH (ref 70–99)
Glucose-Capillary: 270 mg/dL — ABNORMAL HIGH (ref 70–99)
Glucose-Capillary: 319 mg/dL — ABNORMAL HIGH (ref 70–99)

## 2020-11-21 MED ORDER — 0.9 % SODIUM CHLORIDE (POUR BTL) OPTIME
TOPICAL | Status: DC | PRN
  Administered 2020-11-21: 1000 mL

## 2020-11-21 MED ORDER — ACETAMINOPHEN 10 MG/ML IV SOLN: 1000.0000 mg | Freq: Once | INTRAVENOUS | Status: DC | PRN

## 2020-11-21 MED ORDER — OXYCODONE HCL 5 MG/5ML PO SOLN: 5.0000 mg | Freq: Once | ORAL | Status: DC | PRN

## 2020-11-21 MED ORDER — ROCURONIUM BROMIDE 10 MG/ML (PF) SYRINGE
PREFILLED_SYRINGE | INTRAVENOUS | Status: AC
  Filled 2020-11-21: qty 10

## 2020-11-21 MED ORDER — LIDOCAINE HCL (PF) 2 % IJ SOLN
INTRAMUSCULAR | Status: AC
  Filled 2020-11-21: qty 5

## 2020-11-21 MED ORDER — ACETAMINOPHEN 160 MG/5ML PO SOLN: 1000.0000 mg | Freq: Once | ORAL | Status: DC | PRN

## 2020-11-21 MED ORDER — DIPHENHYDRAMINE HCL 50 MG/ML IJ SOLN
INTRAMUSCULAR | Status: AC
  Filled 2020-11-21: qty 1

## 2020-11-21 MED ORDER — VANCOMYCIN HCL IN DEXTROSE 1-5 GM/200ML-% IV SOLN
INTRAVENOUS | Status: AC
  Filled 2020-11-21: qty 200

## 2020-11-21 MED ORDER — FENTANYL CITRATE (PF) 250 MCG/5ML IJ SOLN
INTRAMUSCULAR | Status: DC | PRN
  Administered 2020-11-21: 50 ug via INTRAVENOUS
  Administered 2020-11-21 (×2): 100 ug via INTRAVENOUS

## 2020-11-21 MED ORDER — ONDANSETRON HCL 4 MG/2ML IJ SOLN
INTRAMUSCULAR | Status: DC | PRN
  Administered 2020-11-21: 4 mg via INTRAVENOUS

## 2020-11-21 MED ORDER — INSULIN ASPART 100 UNIT/ML ~~LOC~~ SOLN
8.0000 [IU] | Freq: Once | SUBCUTANEOUS | Status: AC
  Administered 2020-11-21: 8 [IU] via SUBCUTANEOUS

## 2020-11-21 MED ORDER — PHENYLEPHRINE HCL-NACL 10-0.9 MG/250ML-% IV SOLN
INTRAVENOUS | Status: DC | PRN
  Administered 2020-11-21: 30 ug/min via INTRAVENOUS

## 2020-11-21 MED ORDER — VANCOMYCIN HCL 1000 MG IV SOLR
INTRAVENOUS | Status: DC | PRN
  Administered 2020-11-21: 1000 mg via INTRAVENOUS

## 2020-11-21 MED ORDER — ROCURONIUM BROMIDE 10 MG/ML (PF) SYRINGE
PREFILLED_SYRINGE | INTRAVENOUS | Status: DC | PRN
  Administered 2020-11-21: 40 mg via INTRAVENOUS

## 2020-11-21 MED ORDER — ONDANSETRON HCL 4 MG/2ML IJ SOLN
INTRAMUSCULAR | Status: AC
  Filled 2020-11-21: qty 2

## 2020-11-21 MED ORDER — SODIUM CHLORIDE 0.9 % IV SOLN: INTRAVENOUS | Status: DC | PRN

## 2020-11-21 MED ORDER — LIDOCAINE 2% (20 MG/ML) 5 ML SYRINGE
INTRAMUSCULAR | Status: DC | PRN
  Administered 2020-11-21: 60 mg via INTRAVENOUS

## 2020-11-21 MED ORDER — SUCCINYLCHOLINE CHLORIDE 20 MG/ML IJ SOLN
INTRAMUSCULAR | Status: DC | PRN
  Administered 2020-11-21: 120 mg via INTRAVENOUS

## 2020-11-21 MED ORDER — HYDROCODONE-ACETAMINOPHEN 5-325 MG PO TABS: ORAL_TABLET | ORAL | 0 refills | Status: DC

## 2020-11-21 MED ORDER — FENTANYL CITRATE (PF) 100 MCG/2ML IJ SOLN: 25.0000 ug | INTRAMUSCULAR | Status: DC | PRN

## 2020-11-21 MED ORDER — ACETAMINOPHEN 500 MG PO TABS: 1000.0000 mg | ORAL_TABLET | Freq: Once | ORAL | Status: DC | PRN

## 2020-11-21 MED ORDER — INSULIN ASPART 100 UNIT/ML ~~LOC~~ SOLN
SUBCUTANEOUS | Status: AC
  Filled 2020-11-21: qty 1

## 2020-11-21 MED ORDER — SUCCINYLCHOLINE CHLORIDE 200 MG/10ML IV SOSY
PREFILLED_SYRINGE | INTRAVENOUS | Status: AC
  Filled 2020-11-21: qty 10

## 2020-11-21 MED ORDER — DOXYCYCLINE HYCLATE 50 MG PO CAPS: 100.0000 mg | ORAL_CAPSULE | Freq: Two times a day (BID) | ORAL | 0 refills | Status: DC

## 2020-11-21 MED ORDER — OXYCODONE HCL 5 MG PO TABS: 5.0000 mg | ORAL_TABLET | Freq: Once | ORAL | Status: DC | PRN

## 2020-11-21 MED ORDER — DIPHENHYDRAMINE HCL 50 MG/ML IJ SOLN
INTRAMUSCULAR | Status: DC | PRN
  Administered 2020-11-21: 25 mg via INTRAVENOUS

## 2020-11-21 MED ORDER — PROPOFOL 10 MG/ML IV BOLUS
INTRAVENOUS | Status: DC | PRN
  Administered 2020-11-21: 160 mg via INTRAVENOUS

## 2020-11-21 MED ORDER — BUPIVACAINE HCL (PF) 0.25 % IJ SOLN
INTRAMUSCULAR | Status: DC | PRN
  Administered 2020-11-21: 10 mL

## 2020-11-21 NOTE — Op Note (Signed)
NAME: Erik Chung MEDICAL RECORD NO: [id number] DATE OF BIRTH: January 21, 1971 FACILITY: [facility] LOCATION: [facility] OR PHYSICIAN: Tennis Must, MD   OPERATIVE REPORT   DATE OF PROCEDURE: 11/21/20    PREOPERATIVE DIAGNOSIS:   Right long finger infection   POSTOPERATIVE DIAGNOSIS:   Right long finger infection   PROCEDURE:   Incision and drainage right long finger abscess   SURGEON:  Leanora Cover, M.D.   ASSISTANT: none   ANESTHESIA:  General   INTRAVENOUS FLUIDS:  Per anesthesia flow sheet.   ESTIMATED BLOOD LOSS:  Minimal.   COMPLICATIONS:  None.   SPECIMENS:   Cultures to micro   TOURNIQUET TIME:    Total Tourniquet Time Documented: Upper Arm (Right) - 28 minutes Total: Upper Arm (Right) - 28 minutes    DISPOSITION:  Stable to PACU.   INDICATIONS: 49 year old male states he burned his right long finger on a grill approximately 2 weeks ago while at work.  He had done well until a few days ago when he began to have swelling and increasing pain in the finger.  Presented to [facility] for evaluation.  Was transferred to [facility] for further care.  He has a swollen long finger at the dorsum of the PIP joint.  Tender to palpation.  No tenderness volarly.  Recommended incision and drainage in the operating room possibly including the PIP joint. Risks, benefits and alternatives of surgery were discussed including the risks of blood loss, infection, damage to nerves, vessels, tendons, ligaments, bone for surgery, need for additional surgery, complications with wound healing, continued pain, stiffness, continued infection, need for repeat irrigation and debridement.Marland Kitchen  He voiced understanding of these risks and elected to proceed.  OPERATIVE COURSE:  After being identified preoperatively by myself,  the patient and I agreed on the procedure and site of the procedure.  The surgical site was marked.  Surgical consent had been signed. He was given IV antibiotics as preoperative  antibiotic prophylaxis. He was transferred to the operating room and placed on the operating table in supine position with the Right upper extremity on an arm board.  General anesthesia was induced by the anesthesiologist.  Right upper extremity was prepped and draped in normal sterile orthopedic fashion.  A surgical pause was performed between the surgeons, anesthesia, and operating room staff and all were in agreement as to the patient, procedure, and site of procedure.  Tourniquet at the proximal aspect of the extremity was inflated to 250 mmHg after exsanguination of the arm with an Esmarch bandage.    Incision was made on the dorsum of the finger including the scabbed over wound.  Gross purulence was encountered.  Cultures were taken for aerobes and anaerobes.  The scabbed portion of the skin was removed sharply with a knife.  The abscess cavity was explored.  It was well delineated.  Was copiously irrigated with sterile saline.  The extensor tendon was examined.  There was no rent or hole in the tendon.  There did not appear to be fluid underneath the tendon in the PIP joint.  The wound was again copiously irrigated with sterile saline.  Was then packed with plain packing.  There was dressed with sterile 4 x 4 and wrapped with a Coban dressing lightly.  An AlumaFoam splint was placed and wrapped lightly with Coban dressing.  A digital block was performed with quarter percent plain Marcaine to aid in postoperative analgesia.  The tourniquet was deflated at 28 minutes.  Fingertips  were pink with brisk capillary refill after deflation of tourniquet.  The operative  drapes were broken down.  The patient was awoken from anesthesia safely.  He was transferred back to the stretcher and taken to PACU in stable condition.  I will see him back in the office in 3-4 days for postoperative followup.  I will give him a prescription for Norco 5/325 1-2 tabs PO q6 hours prn pain, dispense # 20 and Doxycycline 100 mg p.o.  twice daily x7 days.   Leanora Cover, MD Electronically signed, 11/21/20

## 2020-11-21 NOTE — Discharge Instructions (Signed)

## 2020-11-21 NOTE — Transfer of Care (Signed)
Immediate Anesthesia Transfer of Care Note  Patient: Erik Chung  Procedure(s) Performed: IRRIGATION AND DEBRIDEMENT RIGHT LONG FINGER (Right Finger)  Patient Location: PACU  Anesthesia Type:General  Level of Consciousness: drowsy and patient cooperative  Airway & Oxygen Therapy: Patient Spontanous Breathing and Patient connected to face mask oxygen  Post-op Assessment: Report given to RN and Post -op Vital signs reviewed and stable  Post vital signs: Reviewed and stable  Last Vitals:  Vitals Value Taken Time  BP 152/72 11/21/20 0105  Temp 36.9 C 11/21/20 0105  Pulse 94 11/21/20 0108  Resp 31 11/21/20 0108  SpO2 98 % 11/21/20 0108  Vitals shown include unvalidated device data.  Last Pain:  Vitals:   11/20/20 2321  TempSrc: Oral  PainSc: 10-Worst pain ever         Complications: No complications documented.

## 2020-11-21 NOTE — Anesthesia Procedure Notes (Addendum)
Procedure Name: Intubation Date/Time: 11/21/2020 12:07 AM Performed by: Renato Shin, CRNA Pre-anesthesia Checklist: Patient identified, Emergency Drugs available, Suction available and Patient being monitored Patient Re-evaluated:Patient Re-evaluated prior to induction Oxygen Delivery Method: Circle system utilized Preoxygenation: Pre-oxygenation with 100% oxygen Induction Type: IV induction, Rapid sequence and Cricoid Pressure applied Laryngoscope Size: Miller and 3 Grade View: Grade II Tube type: Oral Tube size: 7.5 mm Number of attempts: 1 Airway Equipment and Method: Stylet Placement Confirmation: ETT inserted through vocal cords under direct vision,  positive ETCO2 and breath sounds checked- equal and bilateral Secured at: 22 cm Tube secured with: Tape Dental Injury: Teeth and Oropharynx as per pre-operative assessment

## 2020-11-21 NOTE — Anesthesia Preprocedure Evaluation (Signed)
Anesthesia Evaluation  Patient identified by MRN, date of birth, ID band Patient awake    Reviewed: Allergy & Precautions, NPO status , Patient's Chart, lab work & pertinent test results  History of Anesthesia Complications Negative for: history of anesthetic complications  Airway Mallampati: II  TM Distance: >3 FB Neck ROM: Full    Dental  (+) Teeth Intact, Missing,    Pulmonary neg shortness of breath, neg COPD, neg recent URI, Current SmokerPatient did not abstain from smoking.,  Covid-19 Nucleic Acid Test Results Lab Results      Component                Value               Date                      SARSCOV2NAA              NEGATIVE            11/20/2020              breath sounds clear to auscultation       Cardiovascular hypertension, Pt. on medications (-) angina(-) Past MI and (-) CHF  Rhythm:Regular     Neuro/Psych negative neurological ROS  negative psych ROS   GI/Hepatic negative GI ROS, Neg liver ROS,   Endo/Other  diabetes, Poorly Controlled  Renal/GU Lab Results      Component                Value               Date                      CREATININE               0.90                11/20/2020                Musculoskeletal right long finger abscess   Abdominal   Peds  Hematology Lab Results      Component                Value               Date                      WBC                      10.0                11/20/2020                HGB                      15.0                11/20/2020                HCT                      46.1                11/20/2020                MCV  83.8                11/20/2020                PLT                      229                 11/20/2020              Anesthesia Other Findings   Reproductive/Obstetrics                             Anesthesia Physical Anesthesia Plan  ASA: II and emergent  Anesthesia Plan:  General   Post-op Pain Management:    Induction: Intravenous, Rapid sequence and Cricoid pressure planned  PONV Risk Score and Plan: 1 and Ondansetron and Diphenhydramine  Airway Management Planned: Oral ETT  Additional Equipment: None  Intra-op Plan:   Post-operative Plan: Extubation in OR  Informed Consent: I have reviewed the patients History and Physical, chart, labs and discussed the procedure including the risks, benefits and alternatives for the proposed anesthesia with the patient or authorized representative who has indicated his/her understanding and acceptance.     Dental advisory given  Plan Discussed with: CRNA and Surgeon  Anesthesia Plan Comments:         Anesthesia Quick Evaluation

## 2020-11-26 LAB — AEROBIC/ANAEROBIC CULTURE W GRAM STAIN (SURGICAL/DEEP WOUND): Gram Stain: NONE SEEN

## 2020-11-27 NOTE — Anesthesia Postprocedure Evaluation (Signed)
Anesthesia Post Note  Patient: Erik Chung  Procedure(s) Performed: IRRIGATION AND DEBRIDEMENT RIGHT LONG FINGER (Right Finger)     Patient location during evaluation: PACU Anesthesia Type: General Level of consciousness: awake and alert Pain management: pain level controlled Vital Signs Assessment: post-procedure vital signs reviewed and stable Respiratory status: spontaneous breathing, nonlabored ventilation, respiratory function stable and patient connected to nasal cannula oxygen Cardiovascular status: blood pressure returned to baseline and stable Postop Assessment: no apparent nausea or vomiting Anesthetic complications: no   No complications documented.  Last Vitals:  Vitals:   11/21/20 0145 11/21/20 0200  BP: (!) 158/86 (!) 144/92  Pulse: 90 89  Resp: (!) 25   Temp:  37 C  SpO2: 98% 95%    Last Pain:  Vitals:   11/21/20 0145  TempSrc:   PainSc: Asleep                 Noha Karasik

## 2020-12-24 ENCOUNTER — Inpatient Hospital Stay: Payer: Self-pay | Admitting: Physician Assistant

## 2021-06-06 DIAGNOSIS — R0981 Nasal congestion: Secondary | ICD-10-CM | POA: Diagnosis not present

## 2021-06-06 DIAGNOSIS — Z20822 Contact with and (suspected) exposure to covid-19: Secondary | ICD-10-CM | POA: Diagnosis not present

## 2021-06-06 DIAGNOSIS — R1110 Vomiting, unspecified: Secondary | ICD-10-CM | POA: Diagnosis not present

## 2021-06-06 DIAGNOSIS — U071 COVID-19: Secondary | ICD-10-CM | POA: Diagnosis not present

## 2021-06-11 DIAGNOSIS — R030 Elevated blood-pressure reading, without diagnosis of hypertension: Secondary | ICD-10-CM | POA: Diagnosis not present

## 2021-06-11 DIAGNOSIS — Z8616 Personal history of COVID-19: Secondary | ICD-10-CM | POA: Diagnosis not present

## 2021-06-11 DIAGNOSIS — R0600 Dyspnea, unspecified: Secondary | ICD-10-CM | POA: Diagnosis not present

## 2021-09-03 DIAGNOSIS — Z6831 Body mass index (BMI) 31.0-31.9, adult: Secondary | ICD-10-CM | POA: Diagnosis not present

## 2021-09-03 DIAGNOSIS — E1165 Type 2 diabetes mellitus with hyperglycemia: Secondary | ICD-10-CM | POA: Diagnosis not present

## 2021-09-03 DIAGNOSIS — E559 Vitamin D deficiency, unspecified: Secondary | ICD-10-CM | POA: Diagnosis not present

## 2021-09-03 DIAGNOSIS — E7800 Pure hypercholesterolemia, unspecified: Secondary | ICD-10-CM | POA: Diagnosis not present

## 2021-11-13 ENCOUNTER — Emergency Department (HOSPITAL_BASED_OUTPATIENT_CLINIC_OR_DEPARTMENT_OTHER)
Admission: EM | Admit: 2021-11-13 | Discharge: 2021-11-13 | Disposition: A | Discharge status: Home / Self Care | Payer: BC Managed Care – PPO | Attending: Emergency Medicine | Admitting: Emergency Medicine

## 2021-11-13 ENCOUNTER — Encounter (HOSPITAL_BASED_OUTPATIENT_CLINIC_OR_DEPARTMENT_OTHER): Payer: Self-pay

## 2021-11-13 ENCOUNTER — Other Ambulatory Visit: Payer: Self-pay

## 2021-11-13 DIAGNOSIS — Z20822 Contact with and (suspected) exposure to covid-19: Secondary | ICD-10-CM | POA: Diagnosis not present

## 2021-11-13 DIAGNOSIS — R059 Cough, unspecified: Secondary | ICD-10-CM | POA: Diagnosis not present

## 2021-11-13 DIAGNOSIS — Z794 Long term (current) use of insulin: Secondary | ICD-10-CM | POA: Diagnosis not present

## 2021-11-13 DIAGNOSIS — Z79899 Other long term (current) drug therapy: Secondary | ICD-10-CM | POA: Insufficient documentation

## 2021-11-13 DIAGNOSIS — I10 Essential (primary) hypertension: Secondary | ICD-10-CM | POA: Insufficient documentation

## 2021-11-13 DIAGNOSIS — Z7984 Long term (current) use of oral hypoglycemic drugs: Secondary | ICD-10-CM | POA: Diagnosis not present

## 2021-11-13 DIAGNOSIS — R051 Acute cough: Secondary | ICD-10-CM | POA: Diagnosis not present

## 2021-11-13 DIAGNOSIS — J101 Influenza due to other identified influenza virus with other respiratory manifestations: Secondary | ICD-10-CM | POA: Insufficient documentation

## 2021-11-13 DIAGNOSIS — F17210 Nicotine dependence, cigarettes, uncomplicated: Secondary | ICD-10-CM | POA: Diagnosis not present

## 2021-11-13 DIAGNOSIS — R000 Tachycardia, unspecified: Secondary | ICD-10-CM | POA: Diagnosis not present

## 2021-11-13 DIAGNOSIS — E1110 Type 2 diabetes mellitus with ketoacidosis without coma: Secondary | ICD-10-CM | POA: Insufficient documentation

## 2021-11-13 LAB — RESP PANEL BY RT-PCR (FLU A&B, COVID) ARPGX2
Influenza A by PCR: POSITIVE — AB
Influenza B by PCR: NEGATIVE
SARS Coronavirus 2 by RT PCR: NEGATIVE

## 2021-11-13 NOTE — ED Provider Notes (Signed)
[facility] EMERGENCY DEPARTMENT Provider Note   CSN: [id number] Arrival date & time: 11/13/21  5697     History Chief Complaint  Patient presents with   Cough    Erik Chung is a 50 y.o. male with medical history of DM2, hypertension for which he is noncompliant on medications. Patient states for the last 2 days he has had a nonproductive cough that has not worsened or decreased in severity.  Patient states he is attempted to take NyQuil, DayQuil, guanfacine for this cough with no relief.  Patient states that his roommate is also sick with similar symptoms.  Patient denies receiving a flu shot this year but states he is up-to-date on COVID vaccines.  Patient endorses shortness of breath, sore throat, cough, and pleuritic chest pain worsened with coughing and reproducible on palpation. Patient denies neck stiffness, fevers, trouble swallowing, nausea, vomiting, diarrhea.  Patient states he has smoked 1 pack/day for "years".   " Worsened with coughing.  Worsened   Cough Associated symptoms: chills, headaches, shortness of breath and sore throat   Associated symptoms: no fever and no rhinorrhea       Past Medical History:  Diagnosis Date   Diabetes mellitus    Hypertension    No pertinent past medical history     Patient Active Problem List   Diagnosis Date Noted   Acute pancreatitis 02/25/2014   Diabetes mellitus ([place]) 02/25/2014   HTN (hypertension) 01/18/2012   Hypokalemia 01/18/2012   Dehydration 01/18/2012   Diabetes mellitus, new onset ([place]) 01/16/2012   Diabetes with ketoacidosis 01/16/2012    Past Surgical History:  Procedure Laterality Date   HAND SURGERY     INCISION AND DRAINAGE OF WOUND Right 11/20/2020   Procedure: IRRIGATION AND DEBRIDEMENT RIGHT LONG FINGER;  Surgeon: Leanora Cover, MD;  Location: [facility];  Service: Orthopedics;  Laterality: Right;       Family History  Problem Relation Age of Onset   Diabetes Mother    Diabetes  Father     Social History   Tobacco Use   Smoking status: Every Day    Packs/day: 1.00    Types: Cigarettes   Smokeless tobacco: Never  Substance Use Topics   Alcohol use: Yes    Alcohol/week: 3.0 standard drinks    Types: 3 Cans of beer per week   Drug use: Yes    Types: Marijuana    Home Medications Prior to Admission medications   Medication Sig Start Date End Date Taking? Authorizing Provider  benzonatate (TESSALON) 100 MG capsule Take 1 capsule (100 mg total) by mouth 3 (three) times daily as needed for cough. 10/21/16   Ward, Ozella Almond, PA-C  doxycycline (VIBRAMYCIN) 50 MG capsule Take 2 capsules (100 mg total) by mouth 2 (two) times daily. 11/21/20   Leanora Cover, MD  gemfibrozil (LOPID) 600 MG tablet Take 1 tablet (600 mg total) by mouth 2 (two) times daily before a meal. 02/26/14   Charlynne Cousins, MD  HYDROcodone-acetaminophen [facility]) 5-325 MG tablet 1-2 tabs po q6 hours prn pain 11/21/20   Leanora Cover, MD  insulin aspart protamine- aspart (NOVOLOG MIX 70/30) (70-30) 100 UNIT/ML injection Inject 30-40 Units into the skin See admin instructions. Take 40 units in the morning and 30 units at bedtime    [provider]  insulin glargine (LANTUS) 100 UNIT/ML injection Inject into the skin.    [provider]  insulin lispro (HUMALOG) 100 UNIT/ML KwikPen Inject into the skin. 07/08/20  [provider]  lisinopril (PRINIVIL,ZESTRIL) 10 MG tablet Take 1 tablet (10 mg total) by mouth daily. 02/26/14   Charlynne Cousins, MD  metFORMIN (GLUCOPHAGE) 500 MG tablet Take 2 tablets (1,000 mg total) by mouth 2 (two) times daily with a meal. 02/26/14   Charlynne Cousins, MD    Allergies    Shellfish allergy and Penicillins  Review of Systems   Review of Systems  Constitutional:  Positive for chills. Negative for fever.  HENT:  Positive for sore throat. Negative for rhinorrhea and trouble swallowing.   Respiratory:  Positive for cough and shortness  of breath.   Gastrointestinal:  Negative for abdominal pain, diarrhea, nausea and vomiting.  Musculoskeletal:  Negative for neck stiffness.  Neurological:  Positive for headaches.  All other systems reviewed and are negative.  Physical Exam Updated Vital Signs BP (!) 161/62   Pulse 99   Temp 99.2 F (37.3 C) (Oral)   Resp 16   Ht 5\' 11"  (1.803 m)   Wt 95.7 kg   SpO2 97%   BMI 29.43 kg/m   Physical Exam Constitutional:      General: He is not in acute distress.    Appearance: He is not ill-appearing or toxic-appearing.  HENT:     Head: Normocephalic.     Nose: Nose normal.     Mouth/Throat:     Mouth: Mucous membranes are moist.     Pharynx: Posterior oropharyngeal erythema present.  Eyes:     Extraocular Movements: Extraocular movements intact.     Pupils: Pupils are equal, round, and reactive to light.  Cardiovascular:     Rate and Rhythm: Normal rate and regular rhythm.  Pulmonary:     Effort: Pulmonary effort is normal.     Breath sounds: Normal breath sounds. No wheezing.  Abdominal:     General: Abdomen is flat.     Palpations: Abdomen is soft.     Tenderness: There is no abdominal tenderness.  Musculoskeletal:     Cervical back: Normal range of motion.  Skin:    General: Skin is warm and dry.     Capillary Refill: Capillary refill takes less than 2 seconds.  Neurological:     General: No focal deficit present.     Mental Status: He is alert.  Psychiatric:        Mood and Affect: Mood normal.    ED Results / Procedures / Treatments   Labs (all labs ordered are listed, but only abnormal results are displayed) Labs Reviewed  RESP PANEL BY RT-PCR (FLU A&B, COVID) ARPGX2 - Abnormal; Notable for the following components:      Result Value   Influenza A by PCR POSITIVE (*)    All other components within normal limits    EKG EKG Interpretation  Date/Time:  Friday November 13 2021 10:17:16 EST Ventricular Rate:  101 PR Interval:  175 QRS  Duration: 84 QT Interval:  339 QTC Calculation: 440 R Axis:   88 Text Interpretation: Sinus tachycardia Probable left atrial enlargement Probable left ventricular hypertrophy No significant change since last tracing Confirmed by Fredia Sorrow [phone]) on 11/13/2021 11:13:15 AM  Radiology No results found.  Procedures Procedures   Medications Ordered in ED Medications - No data to display  ED Course  I have reviewed the triage vital signs and the nursing notes.  Pertinent labs & imaging results that were available during my care of the patient were reviewed by me and considered in my medical decision  making (see chart for details).    MDM Rules/Calculators/A&P                          50 year old male who presents to the ED with complaint of 2 days of cough.  Differential diagnosis includes upper respiratory infection (flu/COVID), COPD, pneumonia, ACE-I induced cough and GERD.  Patient states that he does have hypertension, but is noncompliant on medication and has been noncompliant for a number of months now so my suspicion for ACE inhibitor induced cough is low.  Patient denies any kinds of symptoms of heartburn or acid reflux up into his mouth, so suspicion for GERD is low.  Patient does state he has significant smoking history, but on examination patient has clear bilateral lung sounds without wheezes rhonchi or rails so my suspicion for COPD exacerbation is low at this time.  Patient does complain of cough, but denies any kind of rust tinged sputum, and his oxygen saturation on ambulation remains at 97 to 98%.  My suspicion for pneumonia is low at this time.  Patient does state that his roommate has had similar symptoms to himself, and he has not been vaccinated against the flu this season.  I obtained a nasopharyngeal swab which resulted positive for flu A.  This patient will be discharged with instructions on how to symptomatically treat himself.  Patient is nonhypoxic,  handling secretions appropriately, nontoxic-appearing.  Prior to discharge patient was ambulated with pulse oximeter and maintained oxygen saturation 97 to 98%.   He will be instructed to take ibuprofen 600 mg for body aches, and Tylenol for any new onset fevers.  Patient also encouraged to hydrate himself properly.  Patient will be given a work note to excuse himself for work and advised to not return to work until he has been afebrile for 24 to 48 hours.  Patient advised if he experiences any new symptoms such as shortness of breath or rust colored sputum to return to ED for further work-up and management.  Patient expresses understanding with instructions and agreeable to plan.  Patient stable on discharge.  Final Clinical Impression(s) / ED Diagnoses Final diagnoses:  Influenza A  Acute cough    Rx / DC Orders ED Discharge Orders     None        Azucena Cecil, [place] 11/13/21 1254    Fredia Sorrow, MD 11/14/21 [phone]

## 2021-11-13 NOTE — ED Notes (Signed)
Ambulated on r/a SpO2 96-100%, mild DOE.

## 2021-11-13 NOTE — ED Triage Notes (Signed)
Pt reports he has been coughing for 2 days. Chest is hurting due to non productive cough. Pt reports some SOB

## 2021-11-13 NOTE — Discharge Instructions (Addendum)
Return to ED with any new or worsening symptoms such as increased shortness of breath, wheezing Treat body aches/pains with Ibuprofren 600mg  Treat fever with tylenol Continue to hydrate and push fluids to maintain hydration

## 2021-11-26 ENCOUNTER — Emergency Department (HOSPITAL_COMMUNITY)
Admission: EM | Admit: 2021-11-26 | Discharge: 2021-11-27 | Disposition: A | Discharge status: Home / Self Care | Payer: BC Managed Care – PPO | Attending: Emergency Medicine | Admitting: Emergency Medicine

## 2021-11-26 ENCOUNTER — Other Ambulatory Visit: Payer: Self-pay

## 2021-11-26 DIAGNOSIS — E119 Type 2 diabetes mellitus without complications: Secondary | ICD-10-CM | POA: Diagnosis not present

## 2021-11-26 DIAGNOSIS — T23002A Burn of unspecified degree of left hand, unspecified site, initial encounter: Secondary | ICD-10-CM | POA: Diagnosis not present

## 2021-11-26 DIAGNOSIS — Z794 Long term (current) use of insulin: Secondary | ICD-10-CM | POA: Insufficient documentation

## 2021-11-26 DIAGNOSIS — T23202A Burn of second degree of left hand, unspecified site, initial encounter: Secondary | ICD-10-CM | POA: Diagnosis not present

## 2021-11-26 DIAGNOSIS — T310 Burns involving less than 10% of body surface: Secondary | ICD-10-CM | POA: Insufficient documentation

## 2021-11-26 DIAGNOSIS — F17210 Nicotine dependence, cigarettes, uncomplicated: Secondary | ICD-10-CM | POA: Diagnosis not present

## 2021-11-26 DIAGNOSIS — X150XXA Contact with hot stove (kitchen), initial encounter: Secondary | ICD-10-CM | POA: Insufficient documentation

## 2021-11-26 DIAGNOSIS — Y990 Civilian activity done for income or pay: Secondary | ICD-10-CM | POA: Diagnosis not present

## 2021-11-26 NOTE — ED Provider Notes (Signed)
Emergency Medicine Provider Triage Evaluation Note  Erik Chung , a 50 y.o. male  was evaluated in triage.  Pt complains of burn to the left hand.  Patient states that he was at work working in [employer], when he accidentally put his hand on a hot surface to help grab something off the floor and did not realize that it was hot.  He has a burn to the most of the palmar aspect of his hand.  Unclear of last tetanus shot.  Review of Systems  Positive: As above Negative: As above   Physical Exam  BP (!) 139/103 (BP Location: Right Arm)   Pulse 94   Resp 18   SpO2 100%  Gen:   Awake, no distress   Resp:  Normal effort  MSK:   Moves extremities without difficulty  Other:  Superficial/partial-thickness burn to the palmar aspect of his left hand.  Medical Decision Making  Medically screening exam initiated at 11:23 PM.  Appropriate orders placed.  Coralyn Mark Junior Si was informed that the remainder of the evaluation will be completed by another provider, this initial triage assessment does not replace that evaluation, and the importance of remaining in the ED until their evaluation is complete.     Garald Balding, PA-C 11/26/21 [place], [place], DO 11/27/21 [phone]

## 2021-11-26 NOTE — ED Triage Notes (Signed)
Pt here with burn to left hand from hot stove at work.

## 2021-11-27 ENCOUNTER — Emergency Department (HOSPITAL_BASED_OUTPATIENT_CLINIC_OR_DEPARTMENT_OTHER)
Admission: EM | Admit: 2021-11-27 | Discharge: 2021-11-27 | Disposition: A | Discharge status: Home / Self Care | Payer: BC Managed Care – PPO | Source: Home / Self Care | Attending: Emergency Medicine | Admitting: Emergency Medicine

## 2021-11-27 ENCOUNTER — Other Ambulatory Visit: Payer: Self-pay

## 2021-11-27 ENCOUNTER — Encounter (HOSPITAL_BASED_OUTPATIENT_CLINIC_OR_DEPARTMENT_OTHER): Payer: Self-pay

## 2021-11-27 DIAGNOSIS — Z794 Long term (current) use of insulin: Secondary | ICD-10-CM | POA: Insufficient documentation

## 2021-11-27 DIAGNOSIS — X150XXA Contact with hot stove (kitchen), initial encounter: Secondary | ICD-10-CM | POA: Insufficient documentation

## 2021-11-27 DIAGNOSIS — Z7984 Long term (current) use of oral hypoglycemic drugs: Secondary | ICD-10-CM | POA: Insufficient documentation

## 2021-11-27 DIAGNOSIS — F17210 Nicotine dependence, cigarettes, uncomplicated: Secondary | ICD-10-CM | POA: Insufficient documentation

## 2021-11-27 DIAGNOSIS — I10 Essential (primary) hypertension: Secondary | ICD-10-CM | POA: Insufficient documentation

## 2021-11-27 DIAGNOSIS — E119 Type 2 diabetes mellitus without complications: Secondary | ICD-10-CM | POA: Insufficient documentation

## 2021-11-27 DIAGNOSIS — T23252A Burn of second degree of left palm, initial encounter: Secondary | ICD-10-CM

## 2021-11-27 DIAGNOSIS — T23202A Burn of second degree of left hand, unspecified site, initial encounter: Secondary | ICD-10-CM | POA: Diagnosis not present

## 2021-11-27 DIAGNOSIS — T310 Burns involving less than 10% of body surface: Secondary | ICD-10-CM | POA: Diagnosis not present

## 2021-11-27 NOTE — ED Provider Notes (Signed)
[facility] EMERGENCY DEPARTMENT Provider Note  CSN: [id number] Arrival date & time: 11/27/21 4098  Chief Complaint(s) Hand Burn  HPI Erik Chung is a 50 y.o. male    Burn Burn location:  Hand Hand burn location:  L palm Burn quality:  Intact blister, red and painful Time since incident:  5 hours Progression:  Unchanged Pain details:    Severity:  Moderate   Timing:  Constant   Progression:  Waxing and waning Mechanism of burn:  Hot surface Incident location:  Work Relieved by:  None tried Worsened by:  Tactile pressure Tetanus status:  Up to date  Past Medical History Past Medical History:  Diagnosis Date   Diabetes mellitus    Hypertension    No pertinent past medical history    Patient Active Problem List   Diagnosis Date Noted   Acute pancreatitis 02/25/2014   Diabetes mellitus ([place]) 02/25/2014   HTN (hypertension) 01/18/2012   Hypokalemia 01/18/2012   Dehydration 01/18/2012   Diabetes mellitus, new onset ([place]) 01/16/2012   Diabetes with ketoacidosis 01/16/2012   Home Medication(s) Prior to Admission medications   Medication Sig Start Date End Date Taking? Authorizing Provider  benzonatate (TESSALON) 100 MG capsule Take 1 capsule (100 mg total) by mouth 3 (three) times daily as needed for cough. 10/21/16   Ward, Ozella Almond, PA-C  doxycycline (VIBRAMYCIN) 50 MG capsule Take 2 capsules (100 mg total) by mouth 2 (two) times daily. 11/21/20   Leanora Cover, MD  gemfibrozil (LOPID) 600 MG tablet Take 1 tablet (600 mg total) by mouth 2 (two) times daily before a meal. 02/26/14   Charlynne Cousins, MD  HYDROcodone-acetaminophen [facility]) 5-325 MG tablet 1-2 tabs po q6 hours prn pain 11/21/20   Leanora Cover, MD  insulin aspart protamine- aspart (NOVOLOG MIX 70/30) (70-30) 100 UNIT/ML injection Inject 30-40 Units into the skin See admin instructions. Take 40 units in the morning and 30 units at bedtime    [provider]  insulin glargine  (LANTUS) 100 UNIT/ML injection Inject into the skin.    [provider]  insulin lispro (HUMALOG) 100 UNIT/ML KwikPen Inject into the skin. 07/08/20   [provider]  lisinopril (PRINIVIL,ZESTRIL) 10 MG tablet Take 1 tablet (10 mg total) by mouth daily. 02/26/14   Charlynne Cousins, MD  metFORMIN (GLUCOPHAGE) 500 MG tablet Take 2 tablets (1,000 mg total) by mouth 2 (two) times daily with a meal. 02/26/14   Charlynne Cousins, MD                                                                                                                                    Past Surgical History Past Surgical History:  Procedure Laterality Date   HAND SURGERY     INCISION AND DRAINAGE OF WOUND Right 11/20/2020   Procedure: IRRIGATION AND DEBRIDEMENT RIGHT LONG FINGER;  Surgeon: Leanora Cover, MD;  Location: [place];  Service: Orthopedics;  Laterality: Right;   Family History Family History  Problem Relation Age of Onset   Diabetes Mother    Diabetes Father     Social History Social History   Tobacco Use   Smoking status: Every Day    Packs/day: 1.00    Types: Cigarettes   Smokeless tobacco: Never  Vaping Use   Vaping Use: Never used  Substance Use Topics   Alcohol use: Yes    Alcohol/week: 3.0 standard drinks    Types: 3 Cans of beer per week   Drug use: Yes    Types: Marijuana   Allergies Shellfish allergy and Penicillins  Review of Systems Review of Systems All other systems are reviewed and are negative for acute change except as noted in the HPI  Physical Exam Vital Signs  I have reviewed the triage vital signs BP (!) 183/89 (BP Location: Left Arm)   Pulse 92   Temp 98.4 F (36.9 C) (Oral)   Resp 16   Ht 5' 11.5" (1.816 m)   Wt 99.8 kg   SpO2 98%   BMI 30.26 kg/m   Physical Exam Vitals reviewed.  Constitutional:      General: He is not in acute distress.    Appearance: He is well-developed. He is not diaphoretic.  HENT:     Head: Normocephalic and  atraumatic.     Right Ear: External ear normal.     Left Ear: External ear normal.     Nose: Nose normal.     Mouth/Throat:     Mouth: Mucous membranes are moist.  Eyes:     General: No scleral icterus.    Conjunctiva/sclera: Conjunctivae normal.  Neck:     Trachea: Phonation normal.  Cardiovascular:     Rate and Rhythm: Normal rate and regular rhythm.  Pulmonary:     Effort: Pulmonary effort is normal. No respiratory distress.     Breath sounds: No stridor.  Abdominal:     General: There is no distension.  Musculoskeletal:        General: Normal range of motion.       Hands:     Cervical back: Normal range of motion.  Neurological:     Mental Status: He is alert and oriented to person, place, and time.  Psychiatric:        Behavior: Behavior normal.    ED Results and Treatments Labs (all labs ordered are listed, but only abnormal results are displayed) Labs Reviewed - No data to display                                                                                                                       EKG  EKG Interpretation  Date/Time:    Ventricular Rate:    PR Interval:    QRS Duration:   QT Interval:    QTC Calculation:   R Axis:     Text Interpretation:         Radiology No  results found.  Pertinent labs & imaging results that were available during my care of the patient were reviewed by me and considered in my medical decision making (see MDM for details).  Medications Ordered in ED Medications - No data to display                                                                                                                                   Procedures Procedures  (including critical care time)  Medical Decision Making / ED Course I have reviewed the nursing notes for this encounter and the patient's prior records (if available in EHR or on provided paperwork).  Erik Chung was evaluated in Emergency Department on 11/27/2021 for the  symptoms described in the history of present illness. He was evaluated in the context of the global COVID-19 pandemic, which necessitated consideration that the patient might be at risk for infection with the SARS-CoV-2 virus that causes COVID-19. Institutional protocols and algorithms that pertain to the evaluation of patients at risk for COVID-19 are in a state of rapid change based on information released by regulatory bodies including the CDC and federal and state organizations. These policies and algorithms were followed during the patient's care in the ED.     Partial thickness burn. No superimposed infection. Tdap UTD. No need for debridement. Ointment and bandage applied. Continued supportive management recommended.   Final Clinical Impression(s) / ED Diagnoses Final diagnoses:  Partial thickness burn of palm of left hand, initial encounter   The patient appears reasonably screened and/or stabilized for discharge and I doubt any other medical condition or other [facility] requiring further screening, evaluation, or treatment in the ED at this time prior to discharge. Safe for discharge with strict return precautions.  Disposition: Discharge  Condition: Good  I have discussed the results, Dx and Tx plan with the patient/family who expressed understanding and agree(s) with the plan. Discharge instructions discussed at length. The patient/family was given strict return precautions who verbalized understanding of the instructions. No further questions at time of discharge.    ED Discharge Orders     None        Follow Up: Primary care provider  Call  to schedule an appointment for close follow up     This chart was dictated using voice recognition software.  Despite best efforts to proofread,  errors can occur which can change the documentation meaning.    Fatima Blank, MD 11/27/21 [phone]

## 2021-11-27 NOTE — ED Triage Notes (Addendum)
Pt to ED from work. Pt placed left hand on hot stove while working. Pt has blistering area to left palm. Denies numbness/tingling, pt moving all fingers.

## 2021-11-27 NOTE — ED Notes (Signed)
L hand cleaned and dressed by Dr. Leonette Monarch. Pt tolerated well.

## 2021-12-06 ENCOUNTER — Emergency Department (HOSPITAL_BASED_OUTPATIENT_CLINIC_OR_DEPARTMENT_OTHER): Payer: BC Managed Care – PPO

## 2021-12-06 ENCOUNTER — Other Ambulatory Visit: Payer: Self-pay

## 2021-12-06 ENCOUNTER — Encounter (HOSPITAL_BASED_OUTPATIENT_CLINIC_OR_DEPARTMENT_OTHER): Payer: Self-pay | Admitting: *Deleted

## 2021-12-06 ENCOUNTER — Emergency Department (HOSPITAL_BASED_OUTPATIENT_CLINIC_OR_DEPARTMENT_OTHER)
Admission: EM | Admit: 2021-12-06 | Discharge: 2021-12-06 | Disposition: A | Discharge status: Home / Self Care | Payer: BC Managed Care – PPO | Attending: Emergency Medicine | Admitting: Emergency Medicine

## 2021-12-06 DIAGNOSIS — I10 Essential (primary) hypertension: Secondary | ICD-10-CM | POA: Insufficient documentation

## 2021-12-06 DIAGNOSIS — F17210 Nicotine dependence, cigarettes, uncomplicated: Secondary | ICD-10-CM | POA: Diagnosis not present

## 2021-12-06 DIAGNOSIS — R072 Precordial pain: Secondary | ICD-10-CM | POA: Insufficient documentation

## 2021-12-06 DIAGNOSIS — E119 Type 2 diabetes mellitus without complications: Secondary | ICD-10-CM | POA: Insufficient documentation

## 2021-12-06 DIAGNOSIS — R079 Chest pain, unspecified: Secondary | ICD-10-CM | POA: Diagnosis not present

## 2021-12-06 LAB — BASIC METABOLIC PANEL
Anion gap: 8 (ref 5–15)
BUN: 17 mg/dL (ref 6–20)
CO2: 26 mmol/L (ref 22–32)
Calcium: 9.1 mg/dL (ref 8.9–10.3)
Chloride: 97 mmol/L — ABNORMAL LOW (ref 98–111)
Creatinine, Ser: 1.08 mg/dL (ref 0.61–1.24)
GFR, Estimated: >60 mL/min (ref >60)
Glucose, Bld: 445 mg/dL — ABNORMAL HIGH (ref 70–99)
Potassium: 4.7 mmol/L (ref 3.5–5.1)
Sodium: 131 mmol/L — ABNORMAL LOW (ref 135–145)

## 2021-12-06 LAB — CBC
HCT: 43.5 % (ref 39.0–52.0)
Hemoglobin: 14.1 g/dL (ref 13.0–17.0)
MCH: 26.5 pg (ref 26.0–34.0)
MCHC: 32.4 g/dL (ref 30.0–36.0)
MCV: 81.6 fL (ref 80.0–100.0)
Platelets: 221 10*3/uL (ref 150–400)
RBC: 5.33 MIL/uL (ref 4.22–5.81)
RDW: 13.5 % (ref 11.5–15.5)
WBC: 7.8 10*3/uL (ref 4.0–10.5)
nRBC: 0 % (ref 0.0–0.2)

## 2021-12-06 LAB — TROPONIN I (HIGH SENSITIVITY)
Troponin I (High Sensitivity): 16 ng/L (ref <18)
Troponin I (High Sensitivity): 16 ng/L (ref <18)

## 2021-12-06 NOTE — Discharge Instructions (Signed)

## 2021-12-06 NOTE — ED Provider Notes (Signed)
Emergency Department Provider Note   I have reviewed the triage vital signs and the nursing notes.   HISTORY  Chief Complaint Chest Pain   HPI Erik Chung is a 50 y.o. male with past medical history of diabetes and hypertension presents emergency department for evaluation of central chest tightness.  Describes this as a "pulling" pain worse with leaning forward.  He is not having exertional or pleuritic pain symptoms.  He is not feeling short of breath.  He states that when he is leaning forward or putting his head down he feels a pulling through his left chest with a dull sensation which is improved with change in position.  He is not having pain or swelling in the legs.  No fevers or chills.  He notes that he did have flulike symptoms 2 to 3 weeks ago which have resolved.  Past Medical History:  Diagnosis Date   Diabetes mellitus    Hypertension    No pertinent past medical history     Patient Active Problem List   Diagnosis Date Noted   No pertinent past medical history 12/07/2021   Acute pancreatitis 02/25/2014   Diabetes mellitus ([place]) 02/25/2014   HTN (hypertension) 01/18/2012   Hypokalemia 01/18/2012   Dehydration 01/18/2012   Diabetes mellitus, new onset ([place]) 01/16/2012   Diabetes with ketoacidosis 01/16/2012    Past Surgical History:  Procedure Laterality Date   HAND SURGERY     INCISION AND DRAINAGE OF WOUND Right 11/20/2020   Procedure: IRRIGATION AND DEBRIDEMENT RIGHT [place]ion Ke FINGER;  Surgeon: Leanora Cover, MD;  Location: [facility];  Service: Orthopedics;  Laterality: Right;    Allergies Shellfish allergy and Penicillins  Family History  Problem Relation Age of Onset   Diabetes Mother    Diabetes Father     Social History Social History   Tobacco Use   Smoking status: Every Day    Packs/day: 1.00    Types: Cigarettes   Smokeless tobacco: Never  Vaping Use   Vaping Use: Never used  Substance Use Topics   Alcohol use: Yes    Alcohol/week:  3.0 standard drinks    Types: 3 Cans of beer per week    Comment: 2 beers/ day   Drug use: Yes    Types: Marijuana    Review of Systems  Constitutional: No fever/chills Eyes: No visual changes. ENT: No sore throat. Cardiovascular: Positive chest pain. Respiratory: Denies shortness of breath. Gastrointestinal: No abdominal pain.  No nausea, no vomiting.  No diarrhea.  No constipation. Genitourinary: Negative for dysuria. Musculoskeletal: Negative for back pain. Skin: Negative for rash. Neurological: Negative for headaches, focal weakness or numbness.  10-point ROS otherwise negative.  ____________________________________________   PHYSICAL EXAM:  VITAL SIGNS: ED Triage Vitals  Enc Vitals Group     BP 12/06/21 1419 (!) 158/71     Pulse Rate 12/06/21 1419 95     Resp 12/06/21 1419 18     Temp 12/06/21 1419 98.3 F (36.8 C)     Temp Source 12/06/21 1419 Oral     SpO2 12/06/21 1419 100 %     Weight 12/06/21 1419 220 lb (99.8 kg)     Height 12/06/21 1419 5\' 11"  (1.803 m)   Constitutional: Alert and oriented. Well appearing and in no acute distress. Eyes: Conjunctivae are normal.  Head: Atraumatic. Nose: No congestion/rhinnorhea. Mouth/Throat: Mucous membranes are moist.   Neck: No stridor.   Cardiovascular: Normal rate, regular rhythm. Good peripheral circulation. Grossly normal heart sounds.  Respiratory: Normal respiratory effort.  No retractions. Lungs CTAB. Gastrointestinal: Soft and nontender. No distention.  Musculoskeletal: No lower extremity tenderness nor edema. No gross deformities of extremities. Neurologic:  Normal speech and language. No gross focal neurologic deficits are appreciated.  Skin:  Skin is warm, dry and intact. No rash noted.   ____________________________________________   LABS (all labs ordered are listed, but only abnormal results are displayed)  Labs Reviewed  BASIC METABOLIC PANEL - Abnormal; Notable for the following components:       Result Value   Sodium 131 (*)    Chloride 97 (*)    Glucose, Bld 445 (*)    All other components within normal limits  CBC  TROPONIN I (HIGH SENSITIVITY)  TROPONIN I (HIGH SENSITIVITY)   ____________________________________________  EKG   EKG Interpretation  Date/Time:  Sunday December 06 2021 14:22:49 EST Ventricular Rate:  94 PR Interval:  172 QRS Duration: 82 QT Interval:  364 QTC Calculation: 455 R Axis:   85 Text Interpretation: Normal sinus rhythm Cannot rule out Anterior infarct , age undetermined T wave abnormality, consider inferior ischemia Abnormal ECG Similar to prior tracing Confirmed by Nanda Quinton [id number]) on 12/06/2021 2:59:26 PM        ____________________________________________  RADIOLOGY  DG Chest 2 View  Result Date: 12/06/2021 CLINICAL DATA:  Chest pain EXAM: CHEST - 2 VIEW COMPARISON:  Chest radiograph dated October 21, 2016 FINDINGS: The heart size and mediastinal contours are within normal limits. Both lungs are clear. The visualized skeletal structures are unremarkable. IMPRESSION: No active cardiopulmonary disease. Electronically Signed   By: Keane Police D.O.   On: 12/06/2021 14:51    ____________________________________________   PROCEDURES  Procedure(s) performed:   Procedures  None ____________________________________________   INITIAL IMPRESSION / ASSESSMENT AND PLAN / ED COURSE  Pertinent labs & imaging results that were available during my care of the patient were reviewed by me and considered in my medical decision making (see chart for details).   Patient presents emergency department for evaluation of pulling pain in the left chest which does seem positional.  Differential includes musculoskeletal chest pain, pericarditis.  Patient did have a recent viral infection.  He is not exhibiting signs or very concerning symptoms for PE or ACS.  His EKG, while abnormal, his similar to his prior tracings.  He does have several risk  factors for ACS and so do plan for troponin along with labs.  His chest x-ray shows no active cardiopulmonary disease.   Differential includes all life-threatening causes for chest pain. This includes but is not exclusive to acute coronary syndrome, aortic dissection, pulmonary embolism, cardiac tamponade, community-acquired pneumonia, pericarditis, musculoskeletal chest wall pain, etc.  Labs are reassuring. Troponin flat x 2. Pseudohyponatremia noted without evidence of DKA. CXR without PNX or CAP. EKG interpreted as above. Plan for NSAIDs for likely MSK pain vs pericarditis. No evidence to suggest myocarditis. Plan for close PCP and Cardiology follow up. Will continue meds for hyperglycemia at home and f/u closely with PCP who is managing this as an outpatient.   ____________________________________________  FINAL CLINICAL IMPRESSION(S) / ED DIAGNOSES  Final diagnoses:  Precordial chest pain      Note:  This document was prepared using Dragon voice recognition software and may include unintentional dictation errors.  Nanda Quinton, MD, [facility] Emergency Medicine    Gertrue Willette, Wonda Olds, MD 12/10/21 [phone]

## 2021-12-06 NOTE — ED Triage Notes (Signed)
Pt presents with intermittent chest pain over the last week. Reports it as pressure that goes through to his back

## 2021-12-07 DIAGNOSIS — Z789 Other specified health status: Secondary | ICD-10-CM | POA: Insufficient documentation

## 2021-12-08 NOTE — Progress Notes (Deleted)
Cardiology Office Note:    Date:  12/09/2021   ID:  Erik Chung, DOB [birth year], MRN [id number]  PCP:  Merryl Hacker, No  Cardiologist:  Shirlee More, MD   Referring MD: Margette Fast, MD  ASSESSMENT:    1. Chest pain of uncertain etiology   2. Diabetes mellitus, new onset ([place])   3. Primary hypertension   4. Mixed hyperlipidemia    PLAN:    In order of problems listed above:  ***  Next appointment   Medication Adjustments/Labs and Tests Ordered: Current medicines are reviewed at length with the patient today.  Concerns regarding medicines are outlined above.  No orders of the defined types were placed in this encounter.  No orders of the defined types were placed in this encounter.    No chief complaint on file. ***  History of Present Illness:    Erik Chung is a 50 y.o. male with a history of hypertension hyperlipidemia and type 2 diabetes poorly controlled who is being seen today for the evaluation of chest pain after bedside [place] [facility] ED visit 12/06/2021 at the request of Long, Wonda Olds, MD.  High-sensitivity troponin initial and repeat both normal CBC hemoglobin 14 lungs his glucose severely elevated at 445 chest x-ray normal and EKG is abnormal with T wave inversions. Past Medical History:  Diagnosis Date   Diabetes mellitus    Hypertension    No pertinent past medical history     Past Surgical History:  Procedure Laterality Date   HAND SURGERY     INCISION AND DRAINAGE OF WOUND Right 11/20/2020   Procedure: IRRIGATION AND DEBRIDEMENT RIGHT LONG FINGER;  Surgeon: Leanora Cover, MD;  Location: [facility];  Service: Orthopedics;  Laterality: Right;    Current Medications: No outpatient medications have been marked as taking for the 12/09/21 encounter (Appointment) with Richardo Priest, MD.     Allergies:   Shellfish allergy and Penicillins   Social History   Socioeconomic History   Marital status: Legally Separated    Spouse name: Not on file    Number of children: Not on file   Years of education: Not on file   Highest education level: Not on file  Occupational History   Not on file  Tobacco Use   Smoking status: Every Day    Packs/day: 1.00    Types: Cigarettes   Smokeless tobacco: Never  Vaping Use   Vaping Use: Never used  Substance and Sexual Activity   Alcohol use: Yes    Alcohol/week: 3.0 standard drinks    Types: 3 Cans of beer per week    Comment: 2 beers/ day   Drug use: Yes    Types: Marijuana   Sexual activity: Yes  Other Topics Concern   Not on file  Social History Narrative   Not on file   Social Determinants of Health   Financial Resource Strain: Not on file  Food Insecurity: Not on file  Transportation Needs: Not on file  Physical Activity: Not on file  Stress: Not on file  Social Connections: Not on file     Family History: The patient's ***family history includes Diabetes in his father and mother.  ROS:   ROS Please see the history of present illness.    *** All other systems reviewed and are negative.  EKGs/Labs/Other Studies Reviewed:    The following studies were reviewed today: ***  EKG:  EKG is *** ordered today.  The ekg ordered today is personally reviewed  and demonstrates ***  Recent Labs: 12/06/2021: BUN 17; Creatinine, Ser 1.08; Hemoglobin 14.1; Platelets 221; Potassium 4.7; Sodium 131  Recent Lipid Panel    Component Value Date/Time   CHOL 325 (H) 01/17/2012 0357   TRIG 1,475 (H) 02/25/2014 0630   HDL 33 (L) 01/17/2012 0357   CHOLHDL 9.8 01/17/2012 0357   VLDL UNABLE TO CALCULATE IF TRIGLYCERIDE OVER 400 mg/dL 01/17/2012 0357   LDLCALC UNABLE TO CALCULATE IF TRIGLYCERIDE OVER 400 mg/dL 01/17/2012 0357    Physical Exam:    VS:  There were no vitals taken for this visit.    Wt Readings from Last 3 Encounters:  12/06/21 220 lb (99.8 kg)  11/27/21 220 lb (99.8 kg)  11/13/21 211 lb (95.7 kg)     GEN: *** Well nourished, well developed in no acute  distress HEENT: Normal NECK: No JVD; No carotid bruits LYMPHATICS: No lymphadenopathy CARDIAC: ***RRR, no murmurs, rubs, gallops RESPIRATORY:  Clear to auscultation without rales, wheezing or rhonchi  ABDOMEN: Soft, non-tender, non-distended MUSCULOSKELETAL:  No edema; No deformity  SKIN: Warm and dry NEUROLOGIC:  Alert and oriented x 3 PSYCHIATRIC:  Normal affect     Signed, Shirlee More, MD  12/09/2021 7:49 AM    [facility]

## 2021-12-09 ENCOUNTER — Ambulatory Visit: Payer: BC Managed Care – PPO | Admitting: Cardiology

## 2021-12-14 DIAGNOSIS — R0602 Shortness of breath: Secondary | ICD-10-CM | POA: Diagnosis not present

## 2021-12-14 DIAGNOSIS — R079 Chest pain, unspecified: Secondary | ICD-10-CM | POA: Diagnosis not present

## 2021-12-14 DIAGNOSIS — E119 Type 2 diabetes mellitus without complications: Secondary | ICD-10-CM | POA: Diagnosis not present

## 2021-12-14 DIAGNOSIS — I10 Essential (primary) hypertension: Secondary | ICD-10-CM | POA: Diagnosis not present

## 2022-05-02 IMAGING — CR DG CHEST 2V
2 series · 2 of 2 positions shown · non-contrast
Comparison: Chest radiograph dated October 21, 2016

CLINICAL DATA: Chest pain

EXAM:
CHEST - 2 VIEW

[w chest pa]
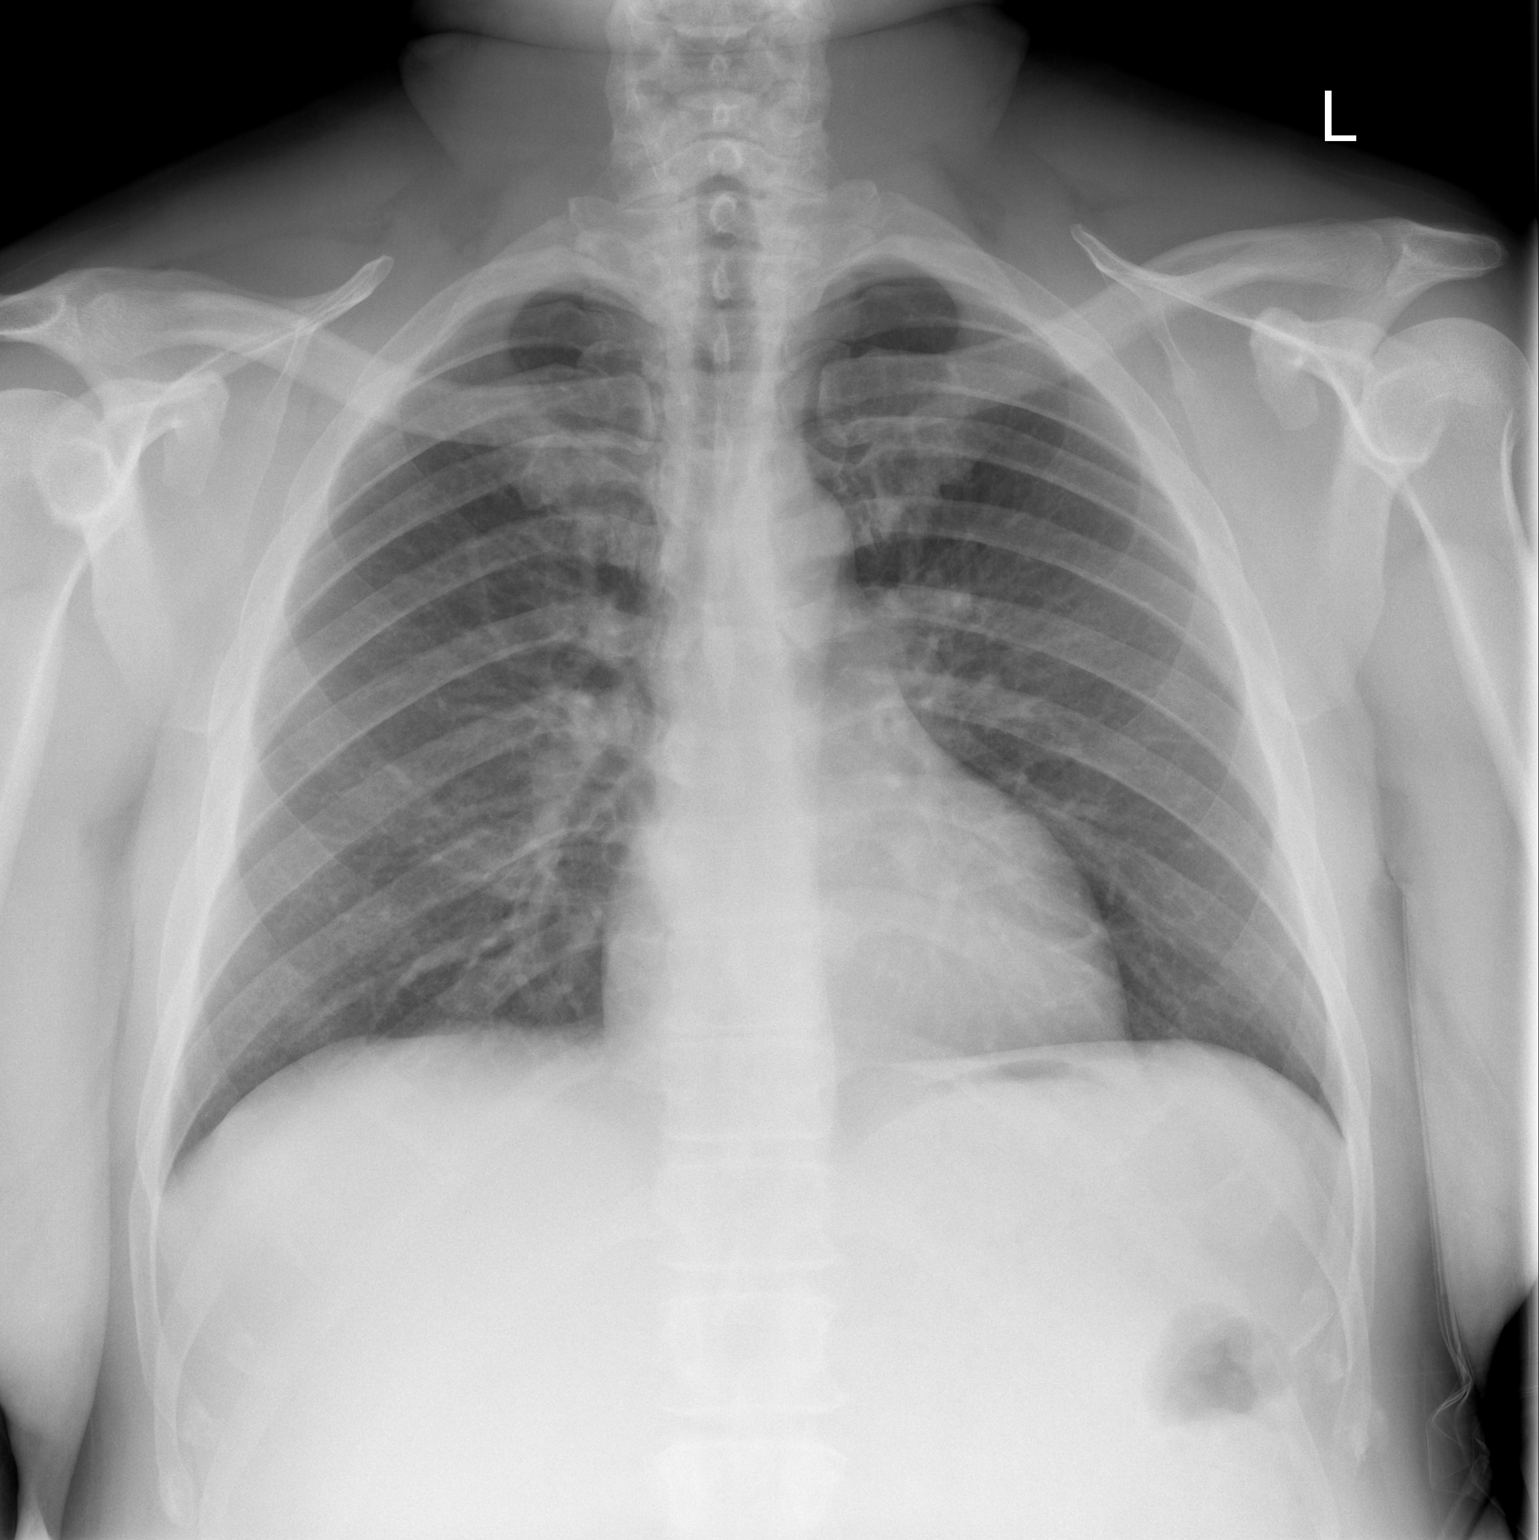

[w chest lat]
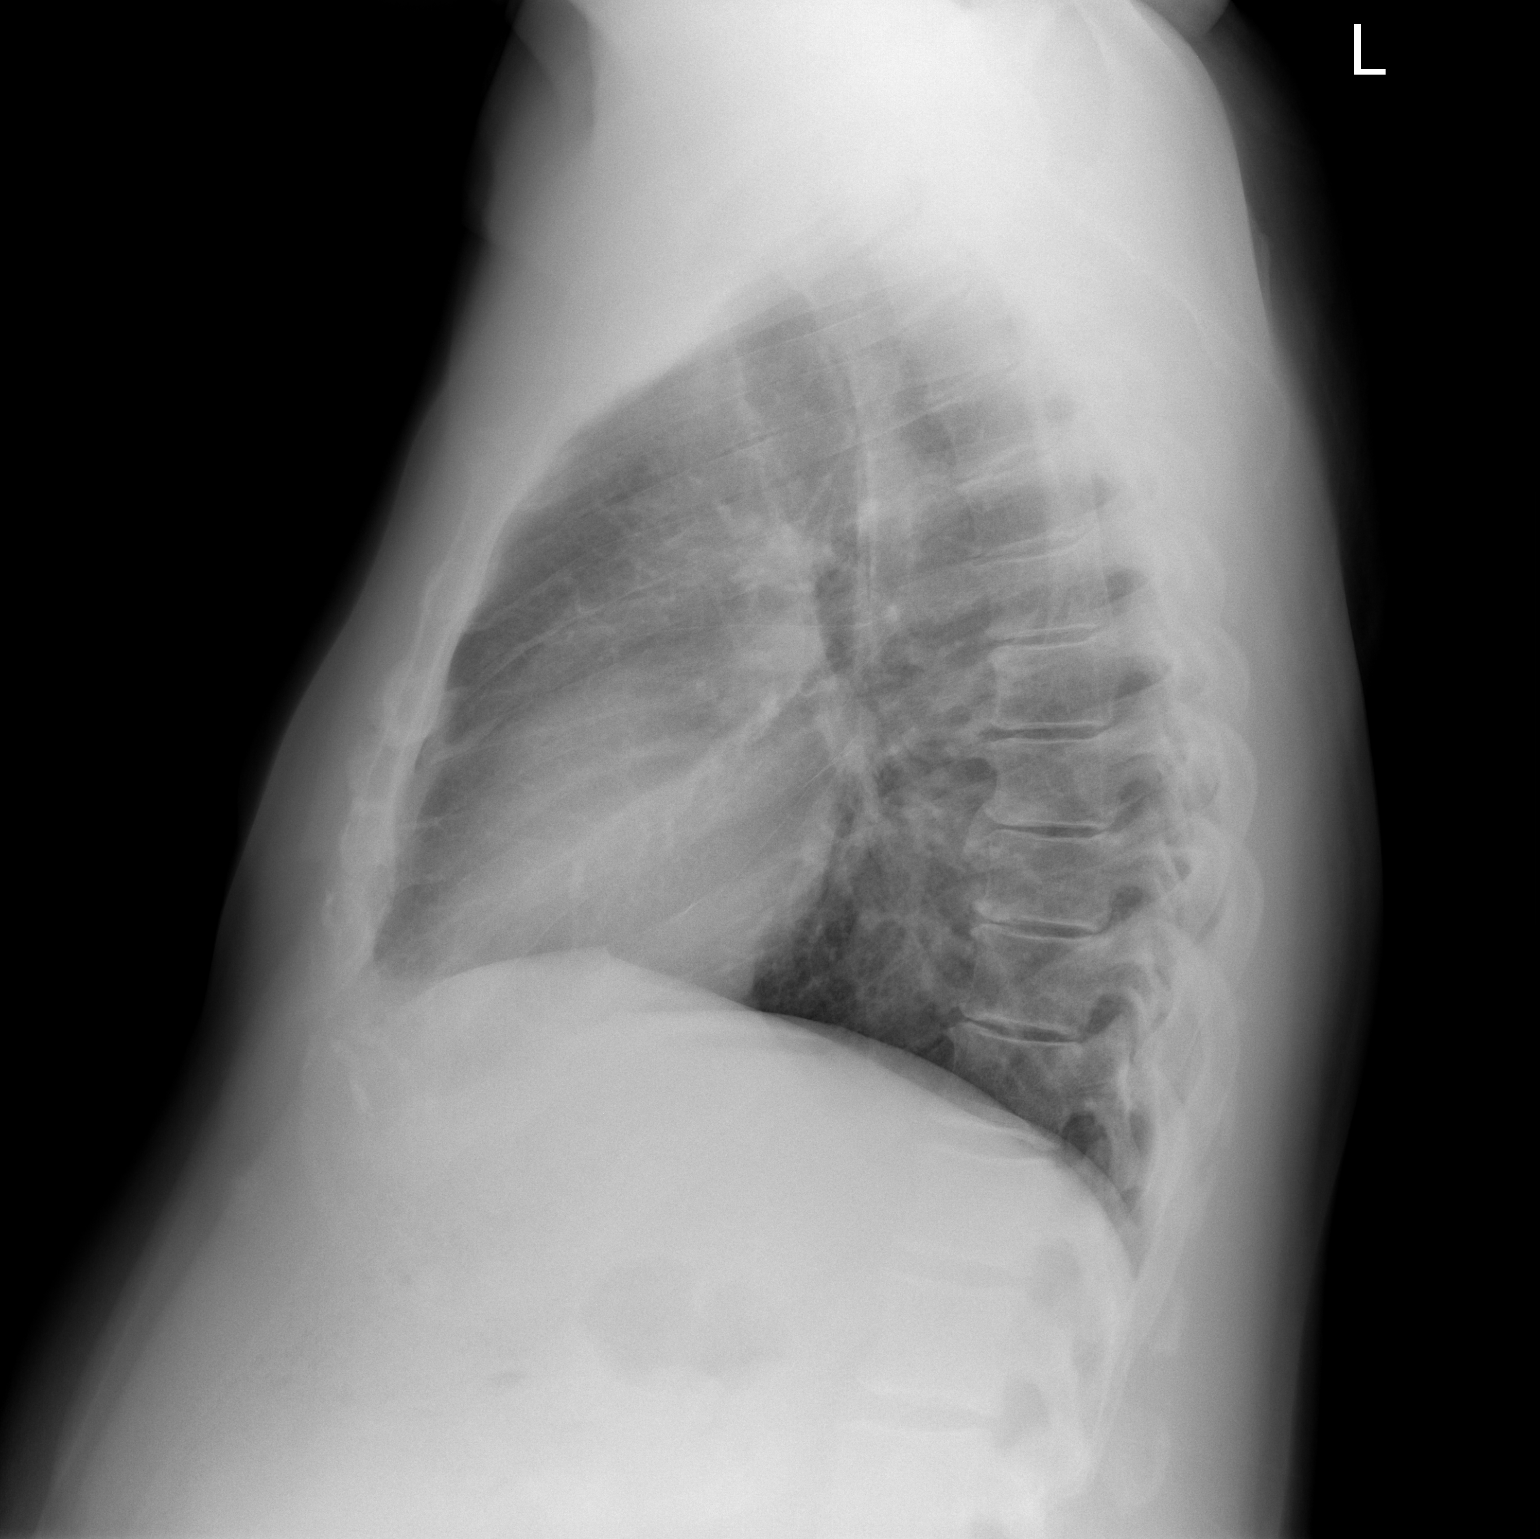

[2 of 2 positions shown; findings below may reference images not displayed]

FINDINGS: The heart size and mediastinal contours are within normal limits.
Both lungs are clear. The visualized skeletal structures are
unremarkable.
IMPRESSION: No active cardiopulmonary disease.

## 2022-05-15 DIAGNOSIS — R0981 Nasal congestion: Secondary | ICD-10-CM | POA: Diagnosis not present

## 2022-05-15 DIAGNOSIS — R197 Diarrhea, unspecified: Secondary | ICD-10-CM | POA: Diagnosis not present

## 2022-05-15 DIAGNOSIS — R059 Cough, unspecified: Secondary | ICD-10-CM | POA: Diagnosis not present

## 2022-05-15 DIAGNOSIS — R52 Pain, unspecified: Secondary | ICD-10-CM | POA: Diagnosis not present

## 2022-05-15 DIAGNOSIS — Z03818 Encounter for observation for suspected exposure to other biological agents ruled out: Secondary | ICD-10-CM | POA: Diagnosis not present

## 2022-08-15 ENCOUNTER — Emergency Department (HOSPITAL_BASED_OUTPATIENT_CLINIC_OR_DEPARTMENT_OTHER)
Admission: EM | Admit: 2022-08-15 | Discharge: 2022-08-15 | Disposition: A | Discharge status: Home / Self Care | Payer: BC Managed Care – PPO | Attending: Emergency Medicine | Admitting: Emergency Medicine

## 2022-08-15 ENCOUNTER — Encounter (HOSPITAL_BASED_OUTPATIENT_CLINIC_OR_DEPARTMENT_OTHER): Payer: Self-pay | Admitting: Emergency Medicine

## 2022-08-15 ENCOUNTER — Other Ambulatory Visit: Payer: Self-pay

## 2022-08-15 DIAGNOSIS — H11421 Conjunctival edema, right eye: Secondary | ICD-10-CM | POA: Diagnosis not present

## 2022-08-15 DIAGNOSIS — H1011 Acute atopic conjunctivitis, right eye: Secondary | ICD-10-CM | POA: Diagnosis not present

## 2022-08-15 DIAGNOSIS — I10 Essential (primary) hypertension: Secondary | ICD-10-CM | POA: Diagnosis not present

## 2022-08-15 DIAGNOSIS — H1031 Unspecified acute conjunctivitis, right eye: Secondary | ICD-10-CM | POA: Diagnosis not present

## 2022-08-15 DIAGNOSIS — E119 Type 2 diabetes mellitus without complications: Secondary | ICD-10-CM | POA: Diagnosis not present

## 2022-08-15 DIAGNOSIS — H5789 Other specified disorders of eye and adnexa: Secondary | ICD-10-CM | POA: Diagnosis not present

## 2022-08-15 MED ORDER — FLUORESCEIN SODIUM 1 MG OP STRP
1.0000 | ORAL_STRIP | Freq: Once | OPHTHALMIC | Status: AC
Start: 2022-08-15 — End: 2022-08-15
  Administered 2022-08-15: 1 via OPHTHALMIC
  Filled 2022-08-15: qty 1

## 2022-08-15 MED ORDER — TETRACAINE HCL 0.5 % OP SOLN
2.0000 [drp] | Freq: Once | OPHTHALMIC | Status: AC
  Administered 2022-08-15: 2 [drp] via OPHTHALMIC
  Filled 2022-08-15: qty 4

## 2022-08-15 NOTE — Discharge Instructions (Addendum)
You were seen in the emergency department for an eye problem.  As we discussed, you have a finding called chemosis. This is swelling of the conjunctiva (white) of your eye, likely due to allergic conjunctivitis (eye irritation caused by allergens).   I recommend eye drops such as Visine Advanced Allergy 1-2 drops into the affected eye up to 4 times daily. You can take benadryl too as needed for itching.   Continue to monitor how you're doing and return to the ER for new or worsening symptoms.

## 2022-08-15 NOTE — ED Notes (Signed)
Dc instructions reviewed with pt no questions or concerns at thsi time. Ambulated out of ED with steady gait.

## 2022-08-15 NOTE — ED Provider Notes (Signed)
[facility] EMERGENCY DEPARTMENT Provider Note   CSN: [id number] Arrival date & time: 08/15/22  1145     History  Chief Complaint  Patient presents with   Eye Problem    Erik Chung is a 51 y.o. male who presents the emergency department complaining of right eye irritation and draining.  Patient states that for the past week his right eye has been very itchy and he thinks it is related to allergies.  He was noticing some draining to his left eye yesterday, but then when he woke up this morning he had a foreign body sensation in his right eye and reported seeing a "bubble".  He has not taken any medications for his symptoms.  Is wearing his glasses, and states he is not quite used to them yet.  However has not noticed any specific changes to his vision since the symptoms started.  No fevers or chills.   Eye Problem Associated symptoms: discharge and itching        Home Medications Prior to Admission medications   Medication Sig Start Date End Date Taking? Authorizing Provider  benzonatate (TESSALON) 100 MG capsule Take 1 capsule (100 mg total) by mouth 3 (three) times daily as needed for cough. 10/21/16   Ward, Ozella Almond, PA-C  doxycycline (VIBRAMYCIN) 50 MG capsule Take 2 capsules (100 mg total) by mouth 2 (two) times daily. 11/21/20   Leanora Cover, MD  gemfibrozil (LOPID) 600 MG tablet Take 1 tablet (600 mg total) by mouth 2 (two) times daily before a meal. 02/26/14   Charlynne Cousins, MD  HYDROcodone-acetaminophen [facility]) 5-325 MG tablet 1-2 tabs po q6 hours prn pain 11/21/20   Leanora Cover, MD  insulin aspart protamine- aspart (NOVOLOG MIX 70/30) (70-30) 100 UNIT/ML injection Inject 30-40 Units into the skin See admin instructions. Take 40 units in the morning and 30 units at bedtime    [provider]  insulin glargine (LANTUS) 100 UNIT/ML injection Inject into the skin.    [provider]  insulin lispro (HUMALOG) 100 UNIT/ML KwikPen  Inject into the skin. 07/08/20   [provider]  lisinopril (PRINIVIL,ZESTRIL) 10 MG tablet Take 1 tablet (10 mg total) by mouth daily. 02/26/14   Charlynne Cousins, MD  metFORMIN (GLUCOPHAGE) 500 MG tablet Take 2 tablets (1,000 mg total) by mouth 2 (two) times daily with a meal. 02/26/14   Charlynne Cousins, MD      Allergies    Shellfish allergy and Penicillins    Review of Systems   Review of Systems  Eyes:  Positive for discharge and itching.  All other systems reviewed and are negative.   Physical Exam Updated Vital Signs BP (!) 150/87   Pulse 95   Temp 98.9 F (37.2 C) (Oral)   Resp 18   Ht '5\' 11"'$  (1.803 m)   Wt 100.7 kg   SpO2 97%   BMI 30.96 kg/m  Physical Exam Vitals and nursing note reviewed.  Constitutional:      Appearance: Normal appearance.  HENT:     Head: Normocephalic and atraumatic.  Eyes:     Conjunctiva/sclera:     Right eye: Right conjunctiva is injected. Chemosis present. No exudate. Pulmonary:     Effort: Pulmonary effort is normal. No respiratory distress.  Skin:    General: Skin is warm and dry.  Neurological:     Mental Status: He is alert.  Psychiatric:        Mood and Affect: Mood normal.  Behavior: Behavior normal.     ED Results / Procedures / Treatments   Labs (all labs ordered are listed, but only abnormal results are displayed) Labs Reviewed - No data to display  EKG None  Radiology No results found.  Procedures Procedures    Medications Ordered in ED Medications  fluorescein ophthalmic strip 1 strip (1 strip Right Eye Given 08/15/22 1403)  tetracaine (PONTOCAINE) 0.5 % ophthalmic solution 2 drop (2 drops Right Eye Given 08/15/22 1403)    ED Course/ Medical Decision Making/ A&P                           Medical Decision Making Risk Prescription drug management.  Patient is a 51 year old male with history of diabetes and hypertension who presents the emergency department complaining of right eye  irritation.  Was noticing some right eye itching for the past week, with clear watery discharge yesterday, and a foreign body sensation this morning.  On exam patient has injection and chemosis of the right eye compared to the left with watery drainage.  Normal vital signs.  No evidence of infection.  Patient had decreased vision in the right eye compared to the left with his glasses, he states he was not wanting to strain his eye.  Discussed with the patient that I think his symptoms are related to allergic conjunctivitis.  The chemosis and clear discharge with a history of right eye itching seems consistent with this.  I recommended over-the-counter allergy medicines as well as allergy relief eyedrops.  As I see no evidence of infection, do not believe he is requiring antibiotics.  Will discharge to home with symptomatic treatment and recommend following up with ophthalmology if symptoms persist.    Final Clinical Impression(s) / ED Diagnoses Final diagnoses:  Chemosis of right conjunctiva  Allergic conjunctivitis of right eye    Rx / DC Orders ED Discharge Orders     None      Portions of this report may have been transcribed using voice recognition software. Every effort was made to ensure accuracy; however, inadvertent computerized transcription errors may be present.   Estill Cotta 08/15/22 1711    Gareth Morgan, MD 08/15/22 2236

## 2022-08-15 NOTE — ED Triage Notes (Signed)
Pt arrives pov, steady gait, c/o RT eye draining and irritation since yesterday. Reports "bubble" in eye Pt denies injury

## 2022-08-15 NOTE — ED Notes (Signed)
Patient states that his eye has drainage yesterday . Today eye has a bubble in it. Rt Denies any blurred vision reports discomfort .Sensative to bright light

## 2022-12-17 ENCOUNTER — Other Ambulatory Visit: Payer: Self-pay

## 2022-12-17 ENCOUNTER — Emergency Department (HOSPITAL_BASED_OUTPATIENT_CLINIC_OR_DEPARTMENT_OTHER)
Admission: EM | Admit: 2022-12-17 | Discharge: 2022-12-17 | Disposition: A | Discharge status: Home / Self Care | Payer: BC Managed Care – PPO | Attending: Emergency Medicine | Admitting: Emergency Medicine

## 2022-12-17 ENCOUNTER — Encounter (HOSPITAL_BASED_OUTPATIENT_CLINIC_OR_DEPARTMENT_OTHER): Payer: Self-pay

## 2022-12-17 DIAGNOSIS — Z794 Long term (current) use of insulin: Secondary | ICD-10-CM | POA: Insufficient documentation

## 2022-12-17 DIAGNOSIS — R051 Acute cough: Secondary | ICD-10-CM | POA: Insufficient documentation

## 2022-12-17 DIAGNOSIS — Z1152 Encounter for screening for COVID-19: Secondary | ICD-10-CM | POA: Insufficient documentation

## 2022-12-17 DIAGNOSIS — Z7984 Long term (current) use of oral hypoglycemic drugs: Secondary | ICD-10-CM | POA: Insufficient documentation

## 2022-12-17 DIAGNOSIS — E119 Type 2 diabetes mellitus without complications: Secondary | ICD-10-CM | POA: Insufficient documentation

## 2022-12-17 DIAGNOSIS — R590 Localized enlarged lymph nodes: Secondary | ICD-10-CM | POA: Insufficient documentation

## 2022-12-17 DIAGNOSIS — Z79899 Other long term (current) drug therapy: Secondary | ICD-10-CM | POA: Insufficient documentation

## 2022-12-17 DIAGNOSIS — R197 Diarrhea, unspecified: Secondary | ICD-10-CM | POA: Insufficient documentation

## 2022-12-17 DIAGNOSIS — I10 Essential (primary) hypertension: Secondary | ICD-10-CM | POA: Insufficient documentation

## 2022-12-17 LAB — RESP PANEL BY RT-PCR (RSV, FLU A&B, COVID)  RVPGX2
Influenza A by PCR: NEGATIVE
Influenza B by PCR: NEGATIVE
Resp Syncytial Virus by PCR: NEGATIVE
SARS Coronavirus 2 by RT PCR: NEGATIVE

## 2022-12-17 NOTE — ED Triage Notes (Signed)
Pt presents with the c/o of swollen lymph nodes in his neck, cough, and runny nose. Pt has had contact with RSV. Symptoms x 3 days.

## 2022-12-17 NOTE — Discharge Instructions (Signed)
You were seen today for evaluation due to swollen lymph nodes.  The respiratory testing was performed today was negative for RSV, COVID, and influenza.  You likely have some sort of other viral illness which is caused some swelling of the lymph nodes.  This will resolve on its own over time as your body fights the illness.  Please be sure to drink plenty of fluids and rest as you are able.  He may take over-the-counter medications if needed for pain or fever control if you develop either.

## 2022-12-17 NOTE — ED Provider Notes (Deleted)
Patient eloped prior to my assessment.    Dorothyann Peng, PA-C 12/17/22 1658

## 2022-12-17 NOTE — ED Provider Notes (Signed)
[place] EMERGENCY DEPARTMENT Provider Note   CSN: [id number] Arrival date & time: 12/17/22  1351     History  Chief Complaint  Patient presents with   Lymphadenopathy    Erik Chung is a 51 y.o. male.  Patient presents the emergency department complaining of cough, diarrhea, and lymphadenopathy for the past 3 days.  Patient states he is concerned because his roommate is a hospice nurse who was recently diagnosed with RSV.  Patient denies sore throat, shortness of breath, abdominal pain, nausea, vomiting, fever.  Past medical history significant for type 2 diabetes, hypertension.  Patient not currently on diabetic therapies due to lack of insurance  HPI     Home Medications Prior to Admission medications   Medication Sig Start Date End Date Taking? Authorizing Provider  benzonatate (TESSALON) 100 MG capsule Take 1 capsule (100 mg total) by mouth 3 (three) times daily as needed for cough. 10/21/16   Ward, Ozella Almond, PA-C  doxycycline (VIBRAMYCIN) 50 MG capsule Take 2 capsules (100 mg total) by mouth 2 (two) times daily. 11/21/20   Leanora Cover, MD  gemfibrozil (LOPID) 600 MG tablet Take 1 tablet (600 mg total) by mouth 2 (two) times daily before a meal. 02/26/14   Charlynne Cousins, MD  HYDROcodone-acetaminophen [facility]) 5-325 MG tablet 1-2 tabs po q6 hours prn pain 11/21/20   Leanora Cover, MD  insulin aspart protamine- aspart (NOVOLOG MIX 70/30) (70-30) 100 UNIT/ML injection Inject 30-40 Units into the skin See admin instructions. Take 40 units in the morning and 30 units at bedtime    [provider]  insulin glargine (LANTUS) 100 UNIT/ML injection Inject into the skin.    [provider]  insulin lispro (HUMALOG) 100 UNIT/ML KwikPen Inject into the skin. 07/08/20   [provider]  lisinopril (PRINIVIL,ZESTRIL) 10 MG tablet Take 1 tablet (10 mg total) by mouth daily. 02/26/14   Charlynne Cousins, MD  metFORMIN (GLUCOPHAGE) 500 MG  tablet Take 2 tablets (1,000 mg total) by mouth 2 (two) times daily with a meal. 02/26/14   Charlynne Cousins, MD      Allergies    Shellfish allergy and Penicillins    Review of Systems   Review of Systems  Constitutional:  Negative for fever.  HENT:  Positive for congestion. Negative for sore throat, trouble swallowing and voice change.        Lymphadenopathy  Respiratory:  Positive for cough.     Physical Exam Updated Vital Signs BP (!) 153/79   Pulse 94   Temp 98.9 F (37.2 C) (Oral)   Resp 20   Ht '5\' 10"'$  (1.778 m)   Wt 99.8 kg   SpO2 99%   BMI 31.57 kg/m  Physical Exam Vitals and nursing note reviewed.  Constitutional:      General: He is not in acute distress.    Appearance: He is well-developed.  HENT:     Head: Normocephalic and atraumatic.     Nose: Nose normal.     Mouth/Throat:     Mouth: Mucous membranes are moist.     Pharynx: No oropharyngeal exudate or posterior oropharyngeal erythema.  Eyes:     Conjunctiva/sclera: Conjunctivae normal.  Cardiovascular:     Rate and Rhythm: Normal rate and regular rhythm.     Heart sounds: No murmur heard. Pulmonary:     Effort: Pulmonary effort is normal. No respiratory distress.     Breath sounds: Normal breath sounds.  Abdominal:  Palpations: Abdomen is soft.     Tenderness: There is no abdominal tenderness.  Musculoskeletal:        General: No swelling.     Cervical back: Neck supple.  Lymphadenopathy:     Cervical: Cervical adenopathy present.  Skin:    General: Skin is warm and dry.     Capillary Refill: Capillary refill takes less than 2 seconds.  Neurological:     Mental Status: He is alert.  Psychiatric:        Mood and Affect: Mood normal.     ED Results / Procedures / Treatments   Labs (all labs ordered are listed, but only abnormal results are displayed) Labs Reviewed  RESP PANEL BY RT-PCR (RSV, FLU A&B, COVID)  [id number]    EKG None  Radiology No results  found.  Procedures Procedures    Medications Ordered in ED Medications - No data to display  ED Course/ Medical Decision Making/ A&P                           Medical Decision Making  Patient presents with chief complaint of swollen cervical lymph nodes with cough and diarrhea.  Differential diagnosis includes but is not limited to viral illness, strep throat, and others  I reviewed the patient's past medical history and found no recent relevant medical visits.  Patient has comorbidities including hypertension and type 2 diabetes  I ordered and reviewed labs.  Pertinent results include negative respiratory panel  There is medication sent for imaging.  The patient does have mild cervical adenopathy but no concerning findings.  No signs of retropharyngeal abscess, peritonsillar abscess.  There is no reported sore throat and no clinical signs of strep throat.  Patient likely has a viral illness causing the swollen lymph nodes.  Plan to discharge patient home with recommendations for supportive care including over-the-counter medication as needed for any pains he may develop and fevers and recommendation for hydration and rest.  Work note provided to patient's request.        Final Clinical Impression(s) / ED Diagnoses Final diagnoses:  Cervical adenopathy  Acute cough  Diarrhea, unspecified type    Rx / DC Orders ED Discharge Orders     None         Ronny Bacon 12/17/22 1709    Tretha Sciara, MD 12/17/22 2137

## 2023-02-08 ENCOUNTER — Other Ambulatory Visit: Payer: Self-pay

## 2023-02-08 ENCOUNTER — Emergency Department (HOSPITAL_BASED_OUTPATIENT_CLINIC_OR_DEPARTMENT_OTHER)
Admission: EM | Admit: 2023-02-08 | Discharge: 2023-02-08 | Disposition: A | Discharge status: Home / Self Care | Payer: PRIVATE HEALTH INSURANCE | Attending: Emergency Medicine | Admitting: Emergency Medicine

## 2023-02-08 ENCOUNTER — Encounter (HOSPITAL_BASED_OUTPATIENT_CLINIC_OR_DEPARTMENT_OTHER): Payer: Self-pay | Admitting: Emergency Medicine

## 2023-02-08 DIAGNOSIS — Z7984 Long term (current) use of oral hypoglycemic drugs: Secondary | ICD-10-CM | POA: Insufficient documentation

## 2023-02-08 DIAGNOSIS — Z79899 Other long term (current) drug therapy: Secondary | ICD-10-CM | POA: Diagnosis not present

## 2023-02-08 DIAGNOSIS — I10 Essential (primary) hypertension: Secondary | ICD-10-CM | POA: Insufficient documentation

## 2023-02-08 DIAGNOSIS — E119 Type 2 diabetes mellitus without complications: Secondary | ICD-10-CM | POA: Diagnosis not present

## 2023-02-08 DIAGNOSIS — E1165 Type 2 diabetes mellitus with hyperglycemia: Secondary | ICD-10-CM | POA: Diagnosis not present

## 2023-02-08 DIAGNOSIS — I509 Heart failure, unspecified: Secondary | ICD-10-CM | POA: Diagnosis not present

## 2023-02-08 DIAGNOSIS — I110 Hypertensive heart disease with heart failure: Secondary | ICD-10-CM | POA: Diagnosis not present

## 2023-02-08 DIAGNOSIS — R2243 Localized swelling, mass and lump, lower limb, bilateral: Secondary | ICD-10-CM | POA: Diagnosis present

## 2023-02-08 DIAGNOSIS — Z794 Long term (current) use of insulin: Secondary | ICD-10-CM | POA: Diagnosis not present

## 2023-02-08 DIAGNOSIS — E1142 Type 2 diabetes mellitus with diabetic polyneuropathy: Secondary | ICD-10-CM

## 2023-02-08 LAB — BASIC METABOLIC PANEL
Anion gap: 8 (ref 5–15)
Anion gap: 7 (ref 5–15)
BUN: 13 mg/dL (ref 6–20)
BUN: 13 mg/dL (ref 6–20)
CO2: 26 mmol/L (ref 22–32)
CO2: 26 mmol/L (ref 22–32)
Calcium: 8.6 mg/dL — ABNORMAL LOW (ref 8.9–10.3)
Calcium: 8.6 mg/dL — ABNORMAL LOW (ref 8.9–10.3)
Chloride: 98 mmol/L (ref 98–111)
Chloride: 102 mmol/L (ref 98–111)
Creatinine, Ser: 0.85 mg/dL (ref 0.61–1.24)
Creatinine, Ser: 0.69 mg/dL (ref 0.61–1.24)
GFR, Estimated: >60 mL/min (ref >60)
GFR, Estimated: >60 mL/min (ref >60)
Glucose, Bld: 531 mg/dL (ref 70–99)
Glucose, Bld: 279 mg/dL — ABNORMAL HIGH (ref 70–99)
Potassium: 3.9 mmol/L (ref 3.5–5.1)
Potassium: 3.9 mmol/L (ref 3.5–5.1)
Sodium: 132 mmol/L — ABNORMAL LOW (ref 135–145)
Sodium: 135 mmol/L (ref 135–145)

## 2023-02-08 LAB — CBC
HCT: 44.9 % (ref 39.0–52.0)
Hemoglobin: 14.5 g/dL (ref 13.0–17.0)
MCH: 26.4 pg (ref 26.0–34.0)
MCHC: 32.3 g/dL (ref 30.0–36.0)
MCV: 81.8 fL (ref 80.0–100.0)
Platelets: 225 10*3/uL (ref 150–400)
RBC: 5.49 MIL/uL (ref 4.22–5.81)
RDW: 13.3 % (ref 11.5–15.5)
WBC: 7.4 10*3/uL (ref 4.0–10.5)
nRBC: 0 % (ref 0.0–0.2)

## 2023-02-08 LAB — BRAIN NATRIURETIC PEPTIDE: B Natriuretic Peptide: 100.3 pg/mL — ABNORMAL HIGH (ref 0.0–100.0)

## 2023-02-08 LAB — CBG MONITORING, ED: Glucose-Capillary: 282 mg/dL — ABNORMAL HIGH (ref 70–99)

## 2023-02-08 MED ORDER — INSULIN ASPART 100 UNIT/ML IJ SOLN
10.0000 [IU] | Freq: Once | INTRAMUSCULAR | Status: AC
  Administered 2023-02-08: 10 [IU] via INTRAVENOUS

## 2023-02-08 MED ORDER — INSULIN ASPART FLEXPEN 100 UNIT/ML ~~LOC~~ SOPN: 30.0000 [IU] | PEN_INJECTOR | Freq: Two times a day (BID) | SUBCUTANEOUS | 3 refills | Status: DC

## 2023-02-08 MED ORDER — LACTATED RINGERS IV BOLUS
1000.0000 mL | Freq: Once | INTRAVENOUS | Status: AC
  Administered 2023-02-08: 1000 mL via INTRAVENOUS

## 2023-02-08 MED ORDER — LISINOPRIL 10 MG PO TABS: 10.0000 mg | ORAL_TABLET | Freq: Every day | ORAL | 0 refills | Status: DC

## 2023-02-08 MED ORDER — GABAPENTIN 300 MG PO CAPS: 300.0000 mg | ORAL_CAPSULE | Freq: Two times a day (BID) | ORAL | 0 refills | Status: DC

## 2023-02-08 MED ORDER — METFORMIN HCL 500 MG PO TABS: 1000.0000 mg | ORAL_TABLET | Freq: Two times a day (BID) | ORAL | 0 refills | Status: DC

## 2023-02-08 NOTE — ED Triage Notes (Addendum)
PT here for bilateral lower leg edema x1 week. Pt denies hx of chf, reports hx of diabetes, reports his roommate his a nurse and looked at his feet last night and told him to come in. He reports no sensation in toes, and having "black" R middle toe. Pt denies sob.

## 2023-02-08 NOTE — ED Provider Notes (Signed)
[place] EMERGENCY DEPARTMENT AT [facility] Provider Note   CSN: [id number] Arrival date & time: 02/08/23  1131     History  Chief Complaint  Patient presents with   Leg Swelling    Erik Chung is a 52 y.o. male presenting today with left leg burning.  He says he also thinks he might be somewhat swollen.  Also concerned that one of his toes is turning dark.  Has a history of heart failure, diabetes and hypertension and has been out of all of his medications for a long time.  Reports that he just recently got health insurance and is trying to find a PCP.  Denies any falls but says that sometimes his feet sensations cause him to feel off balance.  No chest pain or shortness of breath.  HPI     Home Medications Prior to Admission medications   Medication Sig Start Date End Date Taking? Authorizing Provider  benzonatate (TESSALON) 100 MG capsule Take 1 capsule (100 mg total) by mouth 3 (three) times daily as needed for cough. 10/21/16   Ward, Ozella Almond, PA-C  doxycycline (VIBRAMYCIN) 50 MG capsule Take 2 capsules (100 mg total) by mouth 2 (two) times daily. 11/21/20   Leanora Cover, MD  gemfibrozil (LOPID) 600 MG tablet Take 1 tablet (600 mg total) by mouth 2 (two) times daily before a meal. 02/26/14   Charlynne Cousins, MD  HYDROcodone-acetaminophen [facility]) 5-325 MG tablet 1-2 tabs po q6 hours prn pain 11/21/20   Leanora Cover, MD  insulin aspart protamine- aspart (NOVOLOG MIX 70/30) (70-30) 100 UNIT/ML injection Inject 30-40 Units into the skin See admin instructions. Take 40 units in the morning and 30 units at bedtime    [provider]  insulin glargine (LANTUS) 100 UNIT/ML injection Inject into the skin.    [provider]  insulin lispro (HUMALOG) 100 UNIT/ML KwikPen Inject into the skin. 07/08/20   [provider]  lisinopril (PRINIVIL,ZESTRIL) 10 MG tablet Take 1 tablet (10 mg total) by mouth daily. 02/26/14   Charlynne Cousins,  MD  metFORMIN (GLUCOPHAGE) 500 MG tablet Take 2 tablets (1,000 mg total) by mouth 2 (two) times daily with a meal. 02/26/14   Charlynne Cousins, MD      Allergies    Shellfish allergy and Penicillins    Review of Systems   Review of Systems  Physical Exam Updated Vital Signs BP (!) 159/82   Pulse 88   Temp 99 F (37.2 C)   Resp 17   SpO2 97%  Physical Exam Vitals and nursing note reviewed.  Constitutional:      Appearance: Normal appearance.  HENT:     Head: Normocephalic and atraumatic.  Eyes:     General: No scleral icterus.    Conjunctiva/sclera: Conjunctivae normal.  Pulmonary:     Effort: Pulmonary effort is normal. No respiratory distress.  Musculoskeletal:     Comments: Full range of motion of bilateral lower extremities.  Strong DP pulses bilaterally.  Some darkening of the skin of the third right toe.  Onychomycosis of toes.  No edema  Skin:    Findings: No rash.  Neurological:     Mental Status: He is alert.  Psychiatric:        Mood and Affect: Mood normal.     ED Results / Procedures / Treatments   Labs (all labs ordered are listed, but only abnormal results are displayed) Labs Reviewed  BASIC METABOLIC PANEL - Abnormal; Notable  for the following components:      Result Value   Sodium 132 (*)    Glucose, Bld 531 (*)    Calcium 8.6 (*)    All other components within normal limits  BRAIN NATRIURETIC PEPTIDE - Abnormal; Notable for the following components:   B Natriuretic Peptide 100.3 (*)    All other components within normal limits  CBC  BASIC METABOLIC PANEL    EKG None  Radiology No results found.  Procedures Procedures   Medications Ordered in ED Medications  lactated ringers bolus 1,000 mL (1,000 mLs Intravenous New Bag/Given 02/08/23 1428)  insulin aspart (novoLOG) injection 10 Units (10 Units Intravenous Given 02/08/23 1429)    ED Course/ Medical Decision Making/ A&P                             Medical Decision  Making Amount and/or Complexity of Data Reviewed Labs: ordered.  Risk Prescription drug management.   52 year old male presenting today with pain to the feet and some swelling.  Differential includes but is not limited to neuropathy, DVT, CHF exacerbation, cellulitis.  This is not an exhaustive differential.    Past Medical History / Co-morbidities / Social History: Hypertension, type 2 diabetes, CHF   Additional history: Per chart review patient has not seen PCP in many years.  Was seen by internal medicine when he was hospitalized for pancreatitis.  He previously was on lisinopril for hypertension, NovoLog and Lantus for his diabetes but has never been treated for neuropathy   Physical Exam: Pertinent physical exam findings include Strong DP pulses bilaterally.  Onychomycosis of all toes  Lab Tests: I ordered, and personally interpreted labs.  The pertinent results include: Blood sugar on arrival 531 sodium 132, corrects to normal.   Imaging Studies: None indicated at this time     Medications: Bolus and insulin with improved blood sugar   MDM/Disposition: This is a 52 year old male who presented today due to bilateral lower leg burning.  History of diabetes, hypertension and CHF.  He had friends look at his legs and they said he should come to the emergency department.  On my exam he is neurovascularly intact.  Strong DP pulses, normal blood flow to the plantar surface of all of the patient's toes.  Unable to test cap refill secondary to onychomycosis.  Does not appear to have an ischemic toe despite the darkening of the skin of the third right toe.  When he arrived his blood sugar was in the 500s.  His gap was normal and he otherwise looks stable.  He was given insulin and fluids and his blood sugar came down to the 200s.  This is likely somewhere around where he lives and I am comfortable discharging him at this number.  He will be given prescriptions for his insulin, lisinopril  for his blood pressure, gabapentin for neuropathy and instructions for using good Rx for cheaper locations for his prescriptions.  He has also been given several referrals to various PCPs and given return precautions.  Discharged in stable condition    Final Clinical Impression(s) / ED Diagnoses Final diagnoses:  Uncontrolled type 2 diabetes mellitus with hyperglycemia ([place])  Diabetic polyneuropathy associated with type 2 diabetes mellitus ([place])  Uncontrolled hypertension    Rx / DC Orders ED Discharge Orders          Ordered    metFORMIN (GLUCOPHAGE) 500 MG tablet  2 times daily with meals  02/08/23 1551    lisinopril (ZESTRIL) 10 MG tablet  Daily        02/08/23 1551    Insulin Aspart FlexPen (NOVOLOG) 100 UNIT/ML  2 times daily        02/08/23 1551    gabapentin (NEURONTIN) 300 MG capsule  2 times daily        02/08/23 1617           Results and diagnoses were explained to the patient. Return precautions discussed in full. Patient had no additional questions and expressed complete understanding.   This chart was dictated using voice recognition software.  Despite best efforts to proofread,  errors can occur which can change the documentation meaning.    Darliss Ridgel 02/08/23 1650    Gareth Morgan, MD 02/10/23 [phone]

## 2023-02-08 NOTE — Discharge Instructions (Signed)
You came to the emergency department with swelling in your legs and burning in your feet.  This is likely secondary to neuropathy as we discussed.  I have attached information about this.  The most important thing is going to be getting better control of your diabetes.  As we discussed, this can lead to neuropathy as well as damage to many other organs.  I have provided written prescriptions for your medications that you may take to any pharmacy where they appear cheaper.  The best application to find this is called GoodRx.  Metformin.  Please take this.  It may cause stomach upset so be aware of that Lisinopril.  This is for your hypertension and is the medication you used to take NovoLog.  Use this as prescribed. Gabapentin is a medication that is used to treat neuropathy although you can also use Tylenol and ibuprofen.  The gabapentin may make you drowsy so do not take it if you are not driving or operating machinery.  There are 3 primary care offices attached to these discharge papers for you to call and see if you can get an appointment.  They should also be able to help you with your toenail fungus.  Do not hesitate to return with any worsening symptoms.

## 2023-02-08 NOTE — ED Notes (Signed)
Labs redrawn for BMP.

## 2023-03-03 ENCOUNTER — Emergency Department (HOSPITAL_BASED_OUTPATIENT_CLINIC_OR_DEPARTMENT_OTHER): Payer: PRIVATE HEALTH INSURANCE

## 2023-03-03 ENCOUNTER — Other Ambulatory Visit: Payer: Self-pay

## 2023-03-03 ENCOUNTER — Emergency Department (HOSPITAL_BASED_OUTPATIENT_CLINIC_OR_DEPARTMENT_OTHER)
Admission: EM | Admit: 2023-03-03 | Discharge: 2023-03-03 | Disposition: A | Discharge status: Home / Self Care | Payer: PRIVATE HEALTH INSURANCE | Attending: Emergency Medicine | Admitting: Emergency Medicine

## 2023-03-03 DIAGNOSIS — I10 Essential (primary) hypertension: Secondary | ICD-10-CM | POA: Insufficient documentation

## 2023-03-03 DIAGNOSIS — R600 Localized edema: Secondary | ICD-10-CM | POA: Diagnosis not present

## 2023-03-03 DIAGNOSIS — Z794 Long term (current) use of insulin: Secondary | ICD-10-CM | POA: Insufficient documentation

## 2023-03-03 DIAGNOSIS — M7989 Other specified soft tissue disorders: Secondary | ICD-10-CM | POA: Diagnosis present

## 2023-03-03 DIAGNOSIS — Z7984 Long term (current) use of oral hypoglycemic drugs: Secondary | ICD-10-CM | POA: Diagnosis not present

## 2023-03-03 DIAGNOSIS — Z79899 Other long term (current) drug therapy: Secondary | ICD-10-CM | POA: Diagnosis not present

## 2023-03-03 DIAGNOSIS — L03116 Cellulitis of left lower limb: Secondary | ICD-10-CM | POA: Diagnosis not present

## 2023-03-03 DIAGNOSIS — E119 Type 2 diabetes mellitus without complications: Secondary | ICD-10-CM | POA: Insufficient documentation

## 2023-03-03 LAB — BASIC METABOLIC PANEL
Anion gap: 6 (ref 5–15)
BUN: 17 mg/dL (ref 6–20)
CO2: 28 mmol/L (ref 22–32)
Calcium: 9 mg/dL (ref 8.9–10.3)
Chloride: 102 mmol/L (ref 98–111)
Creatinine, Ser: 0.94 mg/dL (ref 0.61–1.24)
GFR, Estimated: >60 mL/min (ref >60)
Glucose, Bld: 140 mg/dL — ABNORMAL HIGH (ref 70–99)
Potassium: 3.8 mmol/L (ref 3.5–5.1)
Sodium: 136 mmol/L (ref 135–145)

## 2023-03-03 LAB — CBC WITH DIFFERENTIAL/PLATELET
Abs Immature Granulocytes: 0.05 10*3/uL (ref 0.00–0.07)
Basophils Absolute: 0 10*3/uL (ref 0.0–0.1)
Basophils Relative: 0 %
Eosinophils Absolute: 0.2 10*3/uL (ref 0.0–0.5)
Eosinophils Relative: 2 %
HCT: 41.1 % (ref 39.0–52.0)
Hemoglobin: 13.3 g/dL (ref 13.0–17.0)
Immature Granulocytes: 1 %
Lymphocytes Relative: 21 %
Lymphs Abs: 2 10*3/uL (ref 0.7–4.0)
MCH: 26.2 pg (ref 26.0–34.0)
MCHC: 32.4 g/dL (ref 30.0–36.0)
MCV: 81.1 fL (ref 80.0–100.0)
Monocytes Absolute: 0.9 10*3/uL (ref 0.1–1.0)
Monocytes Relative: 9 %
Neutro Abs: 6.5 10*3/uL (ref 1.7–7.7)
Neutrophils Relative %: 67 %
Platelets: 244 10*3/uL (ref 150–400)
RBC: 5.07 MIL/uL (ref 4.22–5.81)
RDW: 13.5 % (ref 11.5–15.5)
WBC: 9.7 10*3/uL (ref 4.0–10.5)
nRBC: 0 % (ref 0.0–0.2)

## 2023-03-03 LAB — BRAIN NATRIURETIC PEPTIDE: B Natriuretic Peptide: 109.6 pg/mL — ABNORMAL HIGH (ref 0.0–100.0)

## 2023-03-03 MED ORDER — CEFADROXIL 500 MG PO CAPS: 500.0000 mg | ORAL_CAPSULE | Freq: Two times a day (BID) | ORAL | 0 refills | Status: DC

## 2023-03-03 MED ORDER — FUROSEMIDE 20 MG PO TABS: 20.0000 mg | ORAL_TABLET | Freq: Every day | ORAL | 0 refills | Status: DC

## 2023-03-03 NOTE — ED Provider Notes (Signed)
[place] [facility] Provider Note   CSN: [id number] Arrival date & time: 03/03/23  1643     History  Chief Complaint  Patient presents with   Leg Swelling    Erik Chung is a 52 y.o. male.  HPI   52 year old male presents emergency department with complaints of left leg swelling.  Patient states that symptoms been present for the past 3 days.  Denies history of similar symptoms in the past.  Does note that he had a cut on the lateral aspect of his left foot approximately 1 week ago that he has been caring for at home.  He is also noted some redness on the dorsal aspect of his left foot.  Denies history of DVT/PE, recent surgery/immobilization, known malignancy, travel/immobilization.  Denies fever, chills, chest pain, shortness of breath.  Past medical history significant for diabetes mellitus, hypertension, pancreatitis, hypokalemia, DKA  Home Medications Prior to Admission medications   Medication Sig Start Date End Date Taking? Authorizing Provider  cefadroxil (DURICEF) 500 MG capsule Take 1 capsule (500 mg total) by mouth 2 (two) times daily. 03/03/23  Yes Dion Saucier A, PA  furosemide (LASIX) 20 MG tablet Take 1 tablet (20 mg total) by mouth daily. 03/03/23  Yes Dion Saucier A, PA  benzonatate (TESSALON) 100 MG capsule Take 1 capsule (100 mg total) by mouth 3 (three) times daily as needed for cough. 10/21/16   Ward, Ozella Almond, PA-C  doxycycline (VIBRAMYCIN) 50 MG capsule Take 2 capsules (100 mg total) by mouth 2 (two) times daily. 11/21/20   Leanora Cover, MD  gabapentin (NEURONTIN) 300 MG capsule Take 1 capsule (300 mg total) by mouth 2 (two) times daily for 7 days. 02/08/23 02/15/23  Redwine, Madison A, PA-C  gemfibrozil (LOPID) 600 MG tablet Take 1 tablet (600 mg total) by mouth 2 (two) times daily before a meal. 02/26/14   Charlynne Cousins, MD  HYDROcodone-acetaminophen [facility]) 5-325 MG tablet 1-2 tabs po q6 hours prn pain  11/21/20   Leanora Cover, MD  Insulin Aspart FlexPen (NOVOLOG) 100 UNIT/ML Inject 30 Units into the skin 2 (two) times daily. 02/08/23 03/10/23  Redwine, Madison A, PA-C  insulin aspart protamine- aspart (NOVOLOG MIX 70/30) (70-30) 100 UNIT/ML injection Inject 30-40 Units into the skin See admin instructions. Take 40 units in the morning and 30 units at bedtime    [provider]  insulin glargine (LANTUS) 100 UNIT/ML injection Inject into the skin.    [provider]  insulin lispro (HUMALOG) 100 UNIT/ML KwikPen Inject into the skin. 07/08/20   [provider]  lisinopril (ZESTRIL) 10 MG tablet Take 1 tablet (10 mg total) by mouth daily. 02/08/23   Redwine, Madison A, PA-C  metFORMIN (GLUCOPHAGE) 500 MG tablet Take 2 tablets (1,000 mg total) by mouth 2 (two) times daily with a meal. 02/08/23   Redwine, Madison A, PA-C      Allergies    Shellfish allergy and Penicillins    Review of Systems   Review of Systems  All other systems reviewed and are negative.   Physical Exam Updated Vital Signs BP (!) 171/87 (BP Location: Left Arm)   Pulse 98   Temp 99.3 F (37.4 C) (Oral)   Resp 17   Wt 99.8 kg   SpO2 99%   BMI 31.57 kg/m  Physical Exam Vitals and nursing note reviewed.  Constitutional:      General: He is not in acute distress.    Appearance:  He is well-developed.  HENT:     Head: Normocephalic and atraumatic.  Eyes:     Conjunctiva/sclera: Conjunctivae normal.  Cardiovascular:     Rate and Rhythm: Normal rate and regular rhythm.     Heart sounds: No murmur heard. Pulmonary:     Effort: Pulmonary effort is normal. No respiratory distress.     Breath sounds: Normal breath sounds.  Abdominal:     Palpations: Abdomen is soft.     Tenderness: There is no abdominal tenderness.  Musculoskeletal:        General: No swelling.     Cervical back: Neck supple.     Left lower leg: Edema present.     Comments: Patient has full range of motion of bilateral hips,  knees, ankles, digits.  Pedal pulses 2+ bilaterally.  Compartment soft and supple.  Muscular strength 5 out of 5 bilaterally.  Patient does have 2-3+ pitting edema left lower extremity with no edema appreciated on the right lower extremity.  Mild erythema noted on the dorsal aspect of patient's left foot.  Small superficial skin avulsion noted on the lateral aspect of left foot with overlying necrotic tissue.  No extending erythema, palpable fluctuance, induration.  Skin:    General: Skin is warm and dry.     Capillary Refill: Capillary refill takes less than 2 seconds.  Neurological:     Mental Status: He is alert.  Psychiatric:        Mood and Affect: Mood normal.     ED Results / Procedures / Treatments   Labs (all labs ordered are listed, but only abnormal results are displayed) Labs Reviewed  BRAIN NATRIURETIC PEPTIDE - Abnormal; Notable for the following components:      Result Value   B Natriuretic Peptide 109.6 (*)    All other components within normal limits  BASIC METABOLIC PANEL - Abnormal; Notable for the following components:   Glucose, Bld 140 (*)    All other components within normal limits  CBC WITH DIFFERENTIAL/PLATELET    EKG None  Radiology US Venous Img Lower  Left (DVT Study)  Result Date: 03/03/2023 CLINICAL DATA:  Left lower extremity pain and swelling EXAM: LEFT LOWER EXTREMITY VENOUS DOPPLER ULTRASOUND TECHNIQUE: Gray-scale sonography with compression, as well as color and duplex ultrasound, were performed to evaluate the deep venous system(s) from the level of the common femoral vein through the popliteal and proximal calf veins. COMPARISON:  None Available. FINDINGS: VENOUS Normal compressibility of the common femoral, superficial femoral, and popliteal veins, as well as the visualized calf veins. Visualized portions of profunda femoral vein and great saphenous vein unremarkable. No filling defects to suggest DVT on grayscale or color Doppler imaging. Doppler  waveforms show normal direction of venous flow, normal respiratory plasticity and response to augmentation. Limited views of the contralateral common femoral vein are unremarkable. OTHER None. Limitations: none IMPRESSION: Negative. Electronically Signed   By: Darrin Nipper M.D.   On: 03/03/2023 18:52   DG Chest 2 View  Result Date: 03/03/2023 CLINICAL DATA:  Bilateral lower leg swelling starting 3 days ago. EXAM: CHEST - 2 VIEW COMPARISON:  Chest radiographs 02/06/2021 FINDINGS: Cardiac silhouette and mediastinal contours are within normal limits. The lungs are clear. No pleural effusion or pneumothorax. Moderate multilevel disc space narrowing of the lower thoracic spine with mild anterior endplate osteophytes. IMPRESSION: No active cardiopulmonary disease. Electronically Signed   By: Yvonne Kendall M.D.   On: 03/03/2023 17:17    Procedures Procedures  Medications Ordered in ED Medications - No data to display  ED Course/ Medical Decision Making/ A&P Clinical Course as of 03/03/23 1918  Thu Mar 03, 2023  1750 Swelling x 2 days [CR]    Clinical Course User Index [CR] Wilnette Kales, PA                             Medical Decision Making Amount and/or Complexity of Data Reviewed Labs: ordered. Radiology: ordered.   This patient presents to the ED for concern of leg swelling, this involves an extensive number of treatment options, and is a complaint that carries with it a high risk of complications and morbidity.  The differential diagnosis includes DVT, compartment syndrome, CHF, PE, mechanical obstruction, cellulitis, erysipelas, dependent edema   Co morbidities that complicate the patient evaluation  See HPI   Additional history obtained:  Additional history obtained from EMR External records from outside source obtained and reviewed including hospital records   Lab Tests:  I Ordered, and personally interpreted labs.  The pertinent results include: No leukocytosis noted.   No evidence of anemia.  Platelets within range.  No electrolyte abnormalities appreciated.  No renal dysfunction.  Mildly elevated BNP at 109.6 of which patient had elevated BNP approximately 3 weeks ago of similar value.   Imaging Studies ordered:  I ordered imaging studies including chest x-ray, left lower extremity ultrasound I independently visualized and interpreted imaging which showed  Chest x-ray: No acute cardiopulmonary process. Left lower extremity ultrasound: negative I agree with the radiologist interpretation   Cardiac Monitoring: / EKG:  The patient was maintained on a cardiac monitor.  I personally viewed and interpreted the cardiac monitored which showed an underlying rhythm of: Sinus tachycardia with T wave inversion lateral leads but otherwise, within normal limits.   Consultations Obtained:  N/a   Problem List / ED Course / Critical interventions / Medication management  Left lower extremity swelling/cellulitis Reevaluation of the patient showed that the patient stayed the same I have reviewed the patients home medicines and have made adjustments as needed   Social Determinants of Health:  Chronic cigarette use.  Denies illicit drug use.   Test / Admission - Considered:  Left lower extremity swelling/cellulitis Vitals signs significant for Patient with blood pressure 155/77.  Recommend follow-up with primary care regarding elevated blood pressure.. Otherwise within normal range and stable throughout visit. Laboratory/imaging studies significant for: See above Patient with evidence of left lower extremity swelling.  Workup today overall reassuring.  Patient negative for DVT.  Patient has symmetric pedal pulses with no evidence of compartment syndrome.  Patient does have some cellulitic skin changes of left anterior foot as indicated above.  Will place on [facility] outpatient.  Swelling could be secondary to dependent edema along with inflammatory changes from  cellulitis.  Will treat with Lasix for the next few days as well as recommend compressive therapy and elevation of leg.  Patient recommended follow-up with primary care for reassessment of symptoms.  Treatment plan discussed at length with patient and he acknowledged understanding was agreeable to said plan. Worrisome signs and symptoms were discussed with the patient, and the patient acknowledged understanding to return to the ED if noticed. Patient was stable upon discharge.          Final Clinical Impression(s) / ED Diagnoses Final diagnoses:  Leg swelling  Cellulitis of left lower extremity    Rx / DC Orders ED Discharge  Orders          Ordered    cefadroxil (DURICEF) 500 MG capsule  2 times daily        03/03/23 1856    furosemide (LASIX) 20 MG tablet  Daily        03/03/23 1856              Wilnette Kales, [place] 03/03/23 1918    Drenda Freeze, MD 03/03/23 2328

## 2023-03-03 NOTE — Discharge Instructions (Addendum)
Note that your visit to the emergency department today was overall reassuring.  No evidence of blood clot in your leg.  Given the redness of your left lower leg, will treat with antibiotics twice daily for the next 6 days.  Also send in fluid medicine to help draw fluid off of your leg.  Recommend wearing compression socks as well as elevating leg above the level of your heart.  Recommend follow-up with primary care provider for reassessment of your symptoms.  Please do not hesitate to return to emergency department for worrisome signs and symptoms we discussed become apparent.

## 2023-03-03 NOTE — ED Triage Notes (Signed)
Pt arrives with c/o bilateral lower leg swelling that started 3 days ago. Pt seen for the same last month. Pt has hx of diabetes. Pt denies CP or SOB.

## 2023-03-03 NOTE — ED Notes (Signed)
US at bedside

## 2023-06-10 ENCOUNTER — Encounter (HOSPITAL_BASED_OUTPATIENT_CLINIC_OR_DEPARTMENT_OTHER): Payer: Self-pay | Admitting: Urology

## 2023-06-10 ENCOUNTER — Emergency Department (HOSPITAL_BASED_OUTPATIENT_CLINIC_OR_DEPARTMENT_OTHER)
Admission: EM | Admit: 2023-06-10 | Discharge: 2023-06-10 | Disposition: A | Discharge status: Home / Self Care | Payer: PRIVATE HEALTH INSURANCE | Attending: Emergency Medicine | Admitting: Emergency Medicine

## 2023-06-10 ENCOUNTER — Emergency Department (HOSPITAL_BASED_OUTPATIENT_CLINIC_OR_DEPARTMENT_OTHER): Payer: PRIVATE HEALTH INSURANCE

## 2023-06-10 ENCOUNTER — Other Ambulatory Visit: Payer: Self-pay

## 2023-06-10 DIAGNOSIS — Z79899 Other long term (current) drug therapy: Secondary | ICD-10-CM | POA: Diagnosis not present

## 2023-06-10 DIAGNOSIS — L03213 Periorbital cellulitis: Secondary | ICD-10-CM | POA: Diagnosis present

## 2023-06-10 DIAGNOSIS — Z7984 Long term (current) use of oral hypoglycemic drugs: Secondary | ICD-10-CM | POA: Insufficient documentation

## 2023-06-10 DIAGNOSIS — E1165 Type 2 diabetes mellitus with hyperglycemia: Secondary | ICD-10-CM | POA: Diagnosis not present

## 2023-06-10 DIAGNOSIS — R739 Hyperglycemia, unspecified: Secondary | ICD-10-CM

## 2023-06-10 DIAGNOSIS — I10 Essential (primary) hypertension: Secondary | ICD-10-CM | POA: Insufficient documentation

## 2023-06-10 DIAGNOSIS — Z794 Long term (current) use of insulin: Secondary | ICD-10-CM | POA: Diagnosis not present

## 2023-06-10 LAB — BASIC METABOLIC PANEL
Anion gap: 8 (ref 5–15)
BUN: 14 mg/dL (ref 6–20)
CO2: 27 mmol/L (ref 22–32)
Calcium: 8.4 mg/dL — ABNORMAL LOW (ref 8.9–10.3)
Chloride: 98 mmol/L (ref 98–111)
Creatinine, Ser: 0.79 mg/dL (ref 0.61–1.24)
GFR, Estimated: >60 mL/min (ref >60)
Glucose, Bld: 422 mg/dL — ABNORMAL HIGH (ref 70–99)
Potassium: 3.7 mmol/L (ref 3.5–5.1)
Sodium: 133 mmol/L — ABNORMAL LOW (ref 135–145)

## 2023-06-10 LAB — CBG MONITORING, ED: Glucose-Capillary: 328 mg/dL — ABNORMAL HIGH (ref 70–99)

## 2023-06-10 MED ORDER — CLINDAMYCIN HCL 150 MG PO CAPS: 300.0000 mg | ORAL_CAPSULE | Freq: Three times a day (TID) | ORAL | 0 refills | Status: AC

## 2023-06-10 MED ORDER — FLUORESCEIN SODIUM 1 MG OP STRP
1.0000 | ORAL_STRIP | Freq: Once | OPHTHALMIC | Status: AC
  Administered 2023-06-10: 1 via OPHTHALMIC
  Filled 2023-06-10: qty 1

## 2023-06-10 MED ORDER — CEFDINIR 300 MG PO CAPS: 300.0000 mg | ORAL_CAPSULE | Freq: Two times a day (BID) | ORAL | 0 refills | Status: AC

## 2023-06-10 MED ORDER — IOHEXOL 300 MG/ML  SOLN
75.0000 mL | Freq: Once | INTRAMUSCULAR | Status: AC | PRN
  Administered 2023-06-10: 75 mL via INTRAVENOUS

## 2023-06-10 MED ORDER — TETRACAINE HCL 0.5 % OP SOLN
2.0000 [drp] | Freq: Once | OPHTHALMIC | Status: AC
  Administered 2023-06-10: 2 [drp] via OPHTHALMIC
  Filled 2023-06-10: qty 4

## 2023-06-10 MED ORDER — SODIUM CHLORIDE 0.9 % IV BOLUS
1000.0000 mL | Freq: Once | INTRAVENOUS | Status: AC
  Administered 2023-06-10: 1000 mL via INTRAVENOUS

## 2023-06-10 MED ORDER — INSULIN ASPART 100 UNIT/ML IJ SOLN
5.0000 [IU] | Freq: Once | INTRAMUSCULAR | Status: AC
  Administered 2023-06-10: 5 [IU] via SUBCUTANEOUS

## 2023-06-10 MED ORDER — SODIUM CHLORIDE 0.9 % IV BOLUS: 1000.0000 mL | Freq: Once | INTRAVENOUS | Status: DC

## 2023-06-10 MED ORDER — ONDANSETRON HCL 4 MG PO TABS: 4.0000 mg | ORAL_TABLET | Freq: Four times a day (QID) | ORAL | 0 refills | Status: DC

## 2023-06-10 NOTE — Discharge Instructions (Addendum)
Please pick up the antibiotics I prescribed for you.  I have also given you Zofran to help with any kind of nausea or vomiting.  Please follow-up with the ophthalmologist I have attached your for you regarding recent symptoms and ER visit.  Please take your insulin when you get home as well 2.  If symptoms are to worsen or change please return to ER.

## 2023-06-10 NOTE — ED Provider Notes (Signed)
[place] [facility] Provider Note   CSN: [id number] Arrival date & time: 06/10/23  1415     History  Chief Complaint  Patient presents with   Eye Problem    Erik Chung is a 52 y.o. male history of hypertension, diabetes presented with left eye discharge and swelling that began this morning.  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  Patient denies any foreign body sensation but states he has had clear discharge out of his left eye.  Patient notes that he has swelling over his left eye however does not hurt to move his eye.  Patient denies any recent illness including viral illness, sinusitis, ear infection, sore throat, cough.  Patient wears contacts however has not worn contacts over the past 6 months.  Patient denies any sick contacts.  Patient states he is here because his boss sent him in to get a work note for possible pinkeye.  Home Medications Prior to Admission medications   Medication Sig Start Date End Date Taking? Authorizing Provider  cefdinir (OMNICEF) 300 MG capsule Take 1 capsule (300 mg total) by mouth 2 (two) times daily for 5 days. 06/10/23 06/15/23 Yes Hadlea Furuya, Beverly Gust, PA-C  clindamycin (CLEOCIN) 150 MG capsule Take 2 capsules (300 mg total) by mouth 3 (three) times daily for 5 days. 06/10/23 06/15/23 Yes Jona Zappone, Beverly Gust, PA-C  ondansetron (ZOFRAN) 4 MG tablet Take 1 tablet (4 mg total) by mouth every 6 (six) hours. 06/10/23  Yes Alizza Sacra, Beverly Gust, PA-C  benzonatate (TESSALON) 100 MG capsule Take 1 capsule (100 mg total) by mouth 3 (three) times daily as needed for cough. 10/21/16   Ward, Chase Picket, PA-C  cefadroxil (DURICEF) 500 MG capsule Take 1 capsule (500 mg total) by mouth 2 (two) times daily. 03/03/23   Peter Garter, PA  doxycycline (VIBRAMYCIN) 50 MG capsule Take 2 capsules (100 mg total) by mouth 2 (two) times daily. 11/21/20   Betha Loa, MD   furosemide (LASIX) 20 MG tablet Take 1 tablet (20 mg total) by mouth daily. 03/03/23   Peter Garter, PA  gabapentin (NEURONTIN) 300 MG capsule Take 1 capsule (300 mg total) by mouth 2 (two) times daily for 7 days. 02/08/23 02/15/23  Redwine, Madison A, PA-C  gemfibrozil (LOPID) 600 MG tablet Take 1 tablet (600 mg total) by mouth 2 (two) times daily before a meal. 02/26/14   Marinda Elk, MD  HYDROcodone-acetaminophen [facility]) 5-325 MG tablet 1-2 tabs po q6 hours prn pain 11/21/20   Betha Loa, MD  Insulin Aspart FlexPen (NOVOLOG) 100 UNIT/ML Inject 30 Units into the skin 2 (two) times daily. 02/08/23 03/10/23  Redwine, Madison A, PA-C  insulin aspart protamine- aspart (NOVOLOG MIX 70/30) (70-30) 100 UNIT/ML injection Inject 30-40 Units into the skin See admin instructions. Take 40 units in the morning and 30 units at bedtime    [provider]  insulin glargine (LANTUS) 100 UNIT/ML injection Inject into the skin.    [provider]  insulin lispro (HUMALOG) 100 UNIT/ML KwikPen Inject into the skin. 07/08/20   [provider]  lisinopril (ZESTRIL) 10 MG tablet Take 1 tablet (10 mg total) by mouth daily. 02/08/23   Redwine, Madison A, PA-C  metFORMIN (GLUCOPHAGE) 500 MG tablet Take 2 tablets (1,000 mg total) by mouth 2 (two) times daily with a meal. 02/08/23   Redwine, Madison A, PA-C  Allergies    Shellfish allergy and Penicillins    Review of Systems   Review of Systems See HPI Physical Exam Updated Vital Signs BP (!) 174/97 (BP Location: Left Arm)   Pulse 100   Temp 97.7 F (36.5 C) (Oral)   Resp 20   Ht 5\' 10"  (1.778 m)   Wt 99.8 kg   SpO2 97%   BMI 31.57 kg/m  Physical Exam Constitutional:      General: He is not in acute distress. Eyes:     General: Lids are normal. Vision grossly intact.        Left eye: Discharge (Clear) present.No foreign body or hordeolum.     Intraocular pressure: Left eye pressure is 16 mmHg. Measurements were taken  using a handheld tonometer.    Extraocular Movements: Extraocular movements intact.     Conjunctiva/sclera:     Left eye: Left conjunctiva is injected. No chemosis, exudate or hemorrhage.    Pupils: Pupils are equal, round, and reactive to light.     Left eye: No corneal abrasion or fluorescein uptake. Seidel exam negative.    Comments: Visual fields intact Edema noted around left eye  Neurological:     Mental Status: He is alert.     ED Results / Procedures / Treatments   Labs (all labs ordered are listed, but only abnormal results are displayed) Labs Reviewed  BASIC METABOLIC PANEL - Abnormal; Notable for the following components:      Result Value   Sodium 133 (*)    Glucose, Bld 422 (*)    Calcium 8.4 (*)    All other components within normal limits  CBG MONITORING, ED - Abnormal; Notable for the following components:   Glucose-Capillary 328 (*)    All other components within normal limits    EKG None  Radiology CT Orbits W Contrast  Result Date: 06/10/2023 CLINICAL DATA:  Edema about the left eye.  Periorbital cellulitis. EXAM: CT ORBITS WITH CONTRAST TECHNIQUE: Multidetector CT images was performed according to the standard protocol following intravenous contrast administration. RADIATION DOSE REDUCTION: This exam was performed according to the departmental dose-optimization program which includes automated exposure control, adjustment of the mA and/or kV according to patient size and/or use of iterative reconstruction technique. CONTRAST:  75mL OMNIPAQUE IOHEXOL 300 MG/ML  SOLN COMPARISON:  None Available. FINDINGS: Orbits: Left periorbital soft tissue swelling is most evident superiorly and laterally. No discrete abscess is present. No postseptal soft tissue swelling is present. The lenses are located. Globes are within normal limits. In postseptal orbits are normal bilaterally. Lacrimal glands are normal. Extraocular muscles are within normal limits. The optic nerves are  normal bilaterally. Visible paranasal sinuses: The paranasal sinuses and mastoid air cells are clear. Soft tissues: No other focal soft tissue swelling is present. Osseous: No acute or healing fractures are present. No focal osseous lesions are present. Limited intracranial: Within normal limits. IMPRESSION: 1. Left periorbital soft tissue swelling compatible with cellulitis. No discrete abscess. 2. No postseptal soft tissue swelling or abscess. 3. No acute or healing fractures. Electronically Signed   By: Marin Roberts M.D.   On: 06/10/2023 18:23    Procedures Procedures    Medications Ordered in ED Medications  tetracaine (PONTOCAINE) 0.5 % ophthalmic solution 2 drop (2 drops Left Eye Given by Other 06/10/23 1436)  fluorescein ophthalmic strip 1 strip (1 strip Left Eye Given 06/10/23 1436)  insulin aspart (novoLOG) injection 5 Units (5 Units Subcutaneous Given 06/10/23 1658)  sodium chloride  0.9 % bolus 1,000 mL (0 mLs Intravenous Stopped 06/10/23 1741)  iohexol (OMNIPAQUE) 300 MG/ML solution 75 mL (75 mLs Intravenous Contrast Given 06/10/23 1730)    ED Course/ Medical Decision Making/ A&P                             Medical Decision Making  [facility] [place] 52 y.o. presented today for left eye discharge and edema. Working DDx that I considered at this time includes, but not limited to, preorbital/orbital cellulitis, acute glaucoma, HSV infection, open globe, conjunctivitis, hordeolum/chalazion, FB, CRAO/CRVO.  R/o DDx: orbital cellulitis, acute glaucoma, HSV infection, open globe, conjunctivitis, hordeolum/chalazion, FB, CRAO/CRVO: These are considered less likely due to history of present illness and physical exam findings  Review of prior external notes: 03/03/2023 ED  Unique Tests and My Interpretation:  Visual Acuity: Bilateral Near: 20/20 Bilateral Distance: 20/30 R Near: 20/25 R Distance: 20/30 L Near: 20/20 L Distance: 20/40  IOP: Left eye 16 mmHg Fluoroscein Stain: No  reuptake BMP: Elevated glucose 422 CT orbits with contrast: Left-sided preseptal cellulitis  Discussion with Independent Historian: None  Discussion of Management of Tests: None  Risk: Medium: prescription drug management  Risk Stratification Score: None  Staffed with [place], DO  Plan: Patient presented for eye discharge and edema. On exam patient was in no acute distress and had stable vitals.  Patient's physical exam did show clear discharge out of his left eye with mild edema.  The rest patient's physical exam was unremarkable.  Patient's vision was grossly intact and his visual acuity was reassuring.  Patient does wear contacts but has not worn contacts for the past 6 months.  Patient did not have any flare stain.  Take and had normal intraocular pressure.  No obvious foreign bodies or eyelid abnormalities were noted.  Due to patient's past medical history of diabetes and hypertension along with the mild swelling we decided to discharge CT scan was ordered to evaluate for possible periorbital/orbital cellulitis.  Patient stable this time.  Patient's glucose came back elevated at 422.  Patient dates he has not taken his insulin and metformin today which would explain patient's symptoms.  Patient has not endorsing any abdominal pain and does not appear to be symptomatic from his elevated glucose.  Patient will be given 5U Novolog along with fluids as we wait for patient to go to CT scanner.  CT shows possible left-sided preseptal cellulitis which would explain patient's symptoms.  Patient will be given clindamycin and Omnicef for antibiotics and encouraged to follow-up with an ophthalmologist.  Patient states he has no penicillin allergy and is able to take penicillins and cephalosporins.  EMR does note the patient does get nausea vomiting with penicillins and so Zofran will also be provided just in case.  Patient given work note.  I spoke to the patient about the importance of taking his  insulin when he gets home as his sugars currently in the 300s however patient is not endorsing any symptoms that would initiate further workup for his high sugar at this time.  Patient verbalizes understanding of this.  Patient was given return precautions. Patient stable for discharge at this time.  Patient verbalized understanding of plan.         Final Clinical Impression(s) / ED Diagnoses Final diagnoses:  Preseptal cellulitis  Hyperglycemia    Rx / DC Orders ED Discharge Orders          Ordered  clindamycin (CLEOCIN) 150 MG capsule  3 times daily        06/10/23 1844    cefdinir (OMNICEF) 300 MG capsule  2 times daily        06/10/23 1844    ondansetron (ZOFRAN) 4 MG tablet  Every 6 hours        06/10/23 1844              Netta Corrigan, PA-C 06/10/23 1851    Lockie Mola, Adam, DO 06/10/23 1856

## 2023-06-10 NOTE — ED Triage Notes (Signed)
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

## 2023-06-10 NOTE — ED Notes (Signed)

## 2023-06-10 NOTE — ED Notes (Signed)
CT advised this provider the pt complained of burning in his throat after contrast admin during CT. He was instructed to notify staff if it persists for 10+ minutes, and he advised he would. Staff aware.

## 2023-11-18 ENCOUNTER — Other Ambulatory Visit: Payer: Self-pay

## 2023-11-18 ENCOUNTER — Emergency Department (HOSPITAL_BASED_OUTPATIENT_CLINIC_OR_DEPARTMENT_OTHER)
Admission: EM | Admit: 2023-11-18 | Discharge: 2023-11-18 | Disposition: A | Discharge status: Home / Self Care | Payer: 59 | Attending: Emergency Medicine | Admitting: Emergency Medicine

## 2023-11-18 ENCOUNTER — Encounter (HOSPITAL_BASED_OUTPATIENT_CLINIC_OR_DEPARTMENT_OTHER): Payer: Self-pay | Admitting: Emergency Medicine

## 2023-11-18 ENCOUNTER — Emergency Department (HOSPITAL_BASED_OUTPATIENT_CLINIC_OR_DEPARTMENT_OTHER): Payer: Self-pay

## 2023-11-18 DIAGNOSIS — Z79899 Other long term (current) drug therapy: Secondary | ICD-10-CM | POA: Diagnosis not present

## 2023-11-18 DIAGNOSIS — F17210 Nicotine dependence, cigarettes, uncomplicated: Secondary | ICD-10-CM | POA: Insufficient documentation

## 2023-11-18 DIAGNOSIS — I10 Essential (primary) hypertension: Secondary | ICD-10-CM | POA: Insufficient documentation

## 2023-11-18 DIAGNOSIS — Z7984 Long term (current) use of oral hypoglycemic drugs: Secondary | ICD-10-CM | POA: Insufficient documentation

## 2023-11-18 DIAGNOSIS — E119 Type 2 diabetes mellitus without complications: Secondary | ICD-10-CM | POA: Insufficient documentation

## 2023-11-18 DIAGNOSIS — J069 Acute upper respiratory infection, unspecified: Secondary | ICD-10-CM | POA: Diagnosis not present

## 2023-11-18 DIAGNOSIS — Z794 Long term (current) use of insulin: Secondary | ICD-10-CM | POA: Insufficient documentation

## 2023-11-18 DIAGNOSIS — R059 Cough, unspecified: Secondary | ICD-10-CM | POA: Diagnosis present

## 2023-11-18 DIAGNOSIS — Z1152 Encounter for screening for COVID-19: Secondary | ICD-10-CM | POA: Diagnosis not present

## 2023-11-18 LAB — RESP PANEL BY RT-PCR (RSV, FLU A&B, COVID)  RVPGX2
Influenza A by PCR: NEGATIVE
Influenza B by PCR: NEGATIVE
Resp Syncytial Virus by PCR: NEGATIVE
SARS Coronavirus 2 by RT PCR: NEGATIVE

## 2023-11-18 MED ORDER — LISINOPRIL 10 MG PO TABS
10.0000 mg | ORAL_TABLET | Freq: Once | ORAL | Status: AC
  Administered 2023-11-18: 10 mg via ORAL
  Filled 2023-11-18: qty 1

## 2023-11-18 MED ORDER — BENZONATATE 100 MG PO CAPS
100.0000 mg | ORAL_CAPSULE | Freq: Once | ORAL | Status: AC
  Administered 2023-11-18: 100 mg via ORAL
  Filled 2023-11-18: qty 1

## 2023-11-18 MED ORDER — BENZONATATE 100 MG PO CAPS: 100.0000 mg | ORAL_CAPSULE | Freq: Three times a day (TID) | ORAL | 0 refills | Status: DC

## 2023-11-18 NOTE — Discharge Instructions (Addendum)
You have been evaluated for your symptoms.  Fortunately chest x-ray today did not show any signs of pneumonia.  Your symptoms likely due to a viral infection.  Please take cough medication and continue with current treatment.  Your blood pressure is elevated today, make sure to take your blood pressure regularly and follow-up closely with your doctor for recheck.

## 2023-11-18 NOTE — ED Provider Notes (Signed)
[place] [facility] AT [facility] Provider Note   CSN: [id number] Arrival date & time: 11/18/23  1452     History  Chief Complaint  Patient presents with   Cough    Erik Chung is a 52 y.o. male.  The history is provided by the patient and medical records. No language interpreter was used.  Cough    52 year old male significant history of diabetes, hypertension, alcohol-related pancreatitis presenting with complaint of cold sxs. Patient presents to the emergency room for chief complain of persistent congestion, rhinorrhea and productive cough x 1 week. He reports the cough is worse on some days in comparison to others. He reports one episode of vomiting last night and describes the vomit as clear and watery. He has tried OTC measures such as theraflu, mucinex, halls, etc. at home. His symptoms are aggravated by laying down to go to sleep. He is a tobacco user, typically smoking 1 pack/day but has been smoking less due to his cough and overall discomfort. He reports that his roommate had community acquired pneumonia recently, but has since resolved. Positive for cough, rhinorrhea, a little SOB, hoarseness, chills, and diaphoresis. Denies fever, hemoptysis, fever, weight loss.    Home Medications Prior to Admission medications   Medication Sig Start Date End Date Taking? Authorizing Provider  benzonatate (TESSALON) 100 MG capsule Take 1 capsule (100 mg total) by mouth 3 (three) times daily as needed for cough. 10/21/16   Ward, Chase Picket, PA-C  cefadroxil (DURICEF) 500 MG capsule Take 1 capsule (500 mg total) by mouth 2 (two) times daily. 03/03/23   Peter Garter, PA  doxycycline (VIBRAMYCIN) 50 MG capsule Take 2 capsules (100 mg total) by mouth 2 (two) times daily. 11/21/20   Betha Loa, MD  furosemide (LASIX) 20 MG tablet Take 1 tablet (20 mg total) by mouth daily. 03/03/23   Peter Garter, PA  gabapentin (NEURONTIN) 300 MG capsule Take 1  capsule (300 mg total) by mouth 2 (two) times daily for 7 days. 02/08/23 02/15/23  Redwine, Madison A, PA-C  gemfibrozil (LOPID) 600 MG tablet Take 1 tablet (600 mg total) by mouth 2 (two) times daily before a meal. 02/26/14   Marinda Elk, MD  HYDROcodone-acetaminophen [facility]) 5-325 MG tablet 1-2 tabs po q6 hours prn pain 11/21/20   Betha Loa, MD  Insulin Aspart FlexPen (NOVOLOG) 100 UNIT/ML Inject 30 Units into the skin 2 (two) times daily. 02/08/23 03/10/23  Redwine, Madison A, PA-C  insulin aspart protamine- aspart (NOVOLOG MIX 70/30) (70-30) 100 UNIT/ML injection Inject 30-40 Units into the skin See admin instructions. Take 40 units in the morning and 30 units at bedtime    [provider]  insulin glargine (LANTUS) 100 UNIT/ML injection Inject into the skin.    [provider]  insulin lispro (HUMALOG) 100 UNIT/ML KwikPen Inject into the skin. 07/08/20   [provider]  lisinopril (ZESTRIL) 10 MG tablet Take 1 tablet (10 mg total) by mouth daily. 02/08/23   Redwine, Madison A, PA-C  metFORMIN (GLUCOPHAGE) 500 MG tablet Take 2 tablets (1,000 mg total) by mouth 2 (two) times daily with a meal. 02/08/23   Redwine, Madison A, PA-C  ondansetron (ZOFRAN) 4 MG tablet Take 1 tablet (4 mg total) by mouth every 6 (six) hours. 06/10/23   Netta Corrigan, PA-C      Allergies    Shellfish allergy and Penicillins    Review of Systems   Review of Systems  Respiratory:  Positive for cough.   All other systems reviewed and are negative.   Physical Exam Updated Vital Signs BP (!) 167/75   Pulse 92   Temp 98.1 F (36.7 C) (Oral)   Resp 20   Ht 5\' 11"  (1.803 m)   Wt 99.8 kg   SpO2 97%   BMI 30.68 kg/m  Physical Exam Vitals and nursing note reviewed.  Constitutional:      General: He is not in acute distress.    Appearance: He is well-developed.  HENT:     Head: Atraumatic.     Right Ear: Tympanic membrane normal.     Left Ear: Tympanic membrane normal.      Nose: Nose normal.     Mouth/Throat:     Mouth: Mucous membranes are moist.  Eyes:     Conjunctiva/sclera: Conjunctivae normal.  Cardiovascular:     Rate and Rhythm: Normal rate and regular rhythm.  Pulmonary:     Effort: Pulmonary effort is normal.     Breath sounds: Normal breath sounds. No wheezing, rhonchi or rales.  Abdominal:     Palpations: Abdomen is soft.     Tenderness: There is no abdominal tenderness.  Musculoskeletal:     Cervical back: Neck supple.     Right lower leg: No edema.     Left lower leg: No edema.  Skin:    Findings: No rash.  Neurological:     Mental Status: He is alert.     ED Results / Procedures / Treatments   Labs (all labs ordered are listed, but only abnormal results are displayed) Labs Reviewed  RESP PANEL BY RT-PCR (RSV, FLU A&B, COVID)  RVPGX2    EKG None  Radiology DG Chest 2 View  Result Date: 11/18/2023 CLINICAL DATA:  Cough and congestion for 1 week. EXAM: CHEST - 2 VIEW COMPARISON:  03/03/2023. FINDINGS: Bilateral lung fields are clear. Bilateral costophrenic angles are clear. Normal cardio-mediastinal silhouette. No acute osseous abnormalities. The soft tissues are within normal limits. IMPRESSION: *No active cardiopulmonary disease. Electronically Signed   By: Jules Schick M.D.   On: 11/18/2023 17:39    Procedures Procedures    Medications Ordered in ED Medications  benzonatate (TESSALON) capsule 100 mg (100 mg Oral Given 11/18/23 1747)  lisinopril (ZESTRIL) tablet 10 mg (10 mg Oral Given 11/18/23 1808)    ED Course/ Medical Decision Making/ A&P                                Medical Decision Making  BP (!) 167/75   Pulse 92   Temp 98.1 F (36.7 C) (Oral)   Resp 20   Ht 5\' 11"  (1.803 m)   Wt 99.8 kg   SpO2 97%   BMI 30.68 kg/m   6:14 PM Patient presents to the emergency room for chief complain of persistent congestion, rhinorrhea and productive cough x 1 week. He reports the cough is worse on some days in  comparison to others. He reports one episode of vomiting last night and describes the vomit as clear and watery. He has tried OTC measures such as theraflu, mucinex, halls, etc. at home. His symptoms are aggravated by laying down to go to sleep. He is a tobacco user, typically smoking 1 pack/day but has been smoking less due to his cough and overall discomfort. He reports that his roommate had community acquired pneumonia recently, but has since resolved. Positive for cough,  rhinorrhea, a little SOB, hoarseness, chills, and diaphoresis. Denies fever, hemoptysis, fever, weight loss.  Exam overall reassuring, heart with normal rate and rhythm, lungs are clear to auscultation bilaterally abdomen is soft nontender vital signs are notable for elevated blood pressure as 167/75.  Patient is afebrile no hypoxia.  Patient admits he did not take his blood pressure medication today.  -Labs ordered, independently viewed and interpreted by me.  Labs remarkable for negative covid/flu/rsv -The patient was maintained on a cardiac monitor.  I personally viewed and interpreted the cardiac monitored which showed an underlying rhythm of: NSR -Imaging independently viewed and interpreted by me and I agree with radiologist's interpretation.  Result remarkable for CXR without acute changes -This patient presents to the ED for concern of cold sxs, this involves an extensive number of treatment options, and is a complaint that carries with it a high risk of complications and morbidity.  The differential diagnosis includes viral illness, covid, flu, rsv, pneumonia, pe, pleurisy -Co morbidities that complicate the patient evaluation includes tobacco use -Treatment includes tessalon, lisinopril -Reevaluation of the patient after these medicines showed that the patient improved -PCP office notes or outside notes reviewed -Escalation to admission/observation considered: patients feels much better, is comfortable with discharge, and  will follow up with PCP -Prescription medication considered, patient comfortable with tessalon -Social Determinant of Health considered which includes tobacco use.          Final Clinical Impression(s) / ED Diagnoses Final diagnoses:  Viral URI with cough    Rx / DC Orders ED Discharge Orders          Ordered    benzonatate (TESSALON) 100 MG capsule  Every 8 hours        11/18/23 1818              Fayrene Helper, PA-C 11/18/23 1819    Glyn Ade, MD 11/18/23 2145

## 2023-11-18 NOTE — ED Provider Notes (Incomplete)
[place] [facility] Provider Note   CSN: [id number] Arrival date & time: 11/18/23  1452     History {Add pertinent medical, surgical, social history, OB history to HPI:1} Chief Complaint  Patient presents with  . Cough    [employer] Erik Chung is a 52 y.o. male.  The history is provided by the patient and medical records. No language interpreter was used.  Cough    52 year old male significant history of diabetes, hypertension, alcohol-related pancreatitis presenting with complaint of cold sxs. Patient presents to the emergency room for chief complain of persistent congestion, rhinorrhea and productive cough x 1 week. He reports the cough is worse on some days in comparison to others. He reports one episode of vomiting last night and describes the vomit as clear and watery. He has tried OTC measures such as theraflu, mucinex, halls, etc. at home. His symptoms are aggravated by laying down to go to sleep. He is a tobacco user, typically smoking 1 pack/day but has been smoking less due to his cough and overall discomfort. He reports that his roommate had community acquired pneumonia recently, but has since resolved. Positive for cough, rhinorrhea, a little SOB, hoarseness, chills, and diaphoresis. Denies fever, hemoptysis, fever, weight loss.    Home Medications Prior to Admission medications   Medication Sig Start Date End Date Taking? Authorizing Provider  benzonatate (TESSALON) 100 MG capsule Take 1 capsule (100 mg total) by mouth 3 (three) times daily as needed for cough. 10/21/16   Ward, Chase Picket, PA-C  cefadroxil (DURICEF) 500 MG capsule Take 1 capsule (500 mg total) by mouth 2 (two) times daily. 03/03/23   Peter Garter, PA  doxycycline (VIBRAMYCIN) 50 MG capsule Take 2 capsules (100 mg total) by mouth 2 (two) times daily. 11/21/20   Betha Loa, MD  furosemide (LASIX) 20 MG tablet Take 1 tablet (20 mg total) by mouth daily. 03/03/23    Peter Garter, PA  gabapentin (NEURONTIN) 300 MG capsule Take 1 capsule (300 mg total) by mouth 2 (two) times daily for 7 days. 02/08/23 02/15/23  Redwine, Madison A, PA-C  gemfibrozil (LOPID) 600 MG tablet Take 1 tablet (600 mg total) by mouth 2 (two) times daily before a meal. 02/26/14   Marinda Elk, MD  HYDROcodone-acetaminophen [facility]) 5-325 MG tablet 1-2 tabs po q6 hours prn pain 11/21/20   Betha Loa, MD  Insulin Aspart FlexPen (NOVOLOG) 100 UNIT/ML Inject 30 Units into the skin 2 (two) times daily. 02/08/23 03/10/23  Redwine, Madison A, PA-C  insulin aspart protamine- aspart (NOVOLOG MIX 70/30) (70-30) 100 UNIT/ML injection Inject 30-40 Units into the skin See admin instructions. Take 40 units in the morning and 30 units at bedtime    [provider]  insulin glargine (LANTUS) 100 UNIT/ML injection Inject into the skin.    [provider]  insulin lispro (HUMALOG) 100 UNIT/ML KwikPen Inject into the skin. 07/08/20   [provider]  lisinopril (ZESTRIL) 10 MG tablet Take 1 tablet (10 mg total) by mouth daily. 02/08/23   Redwine, Madison A, PA-C  metFORMIN (GLUCOPHAGE) 500 MG tablet Take 2 tablets (1,000 mg total) by mouth 2 (two) times daily with a meal. 02/08/23   Redwine, Madison A, PA-C  ondansetron (ZOFRAN) 4 MG tablet Take 1 tablet (4 mg total) by mouth every 6 (six) hours. 06/10/23   Netta Corrigan, PA-C      Allergies    Shellfish allergy and Penicillins    Review  of Systems   Review of Systems  Respiratory:  Positive for cough.   All other systems reviewed and are negative.   Physical Exam Updated Vital Signs BP (!) 192/91   Pulse 98   Temp 98.3 F (36.8 C)   Resp 20   Ht 5\' 11"  (1.803 m)   Wt 99.8 kg   SpO2 96%   BMI 30.68 kg/m  Physical Exam Vitals and nursing note reviewed.  Constitutional:      General: He is not in acute distress.    Appearance: He is well-developed.  HENT:     Head: Atraumatic.     Right Ear: Tympanic  membrane normal.     Left Ear: Tympanic membrane normal.     Nose: Nose normal.     Mouth/Throat:     Mouth: Mucous membranes are moist.  Eyes:     Conjunctiva/sclera: Conjunctivae normal.  Cardiovascular:     Rate and Rhythm: Normal rate and regular rhythm.  Pulmonary:     Effort: Pulmonary effort is normal.     Breath sounds: Normal breath sounds. No wheezing, rhonchi or rales.  Abdominal:     Palpations: Abdomen is soft.     Tenderness: There is no abdominal tenderness.  Musculoskeletal:     Cervical back: Neck supple.     Right lower leg: No edema.     Left lower leg: No edema.  Skin:    Findings: No rash.  Neurological:     Mental Status: He is alert.     ED Results / Procedures / Treatments   Labs (all labs ordered are listed, but only abnormal results are displayed) Labs Reviewed  RESP PANEL BY RT-PCR (RSV, FLU A&B, COVID)  RVPGX2    EKG None  Radiology No results found.  Procedures Procedures  {Document cardiac monitor, telemetry assessment procedure when appropriate:1}  Medications Ordered in ED Medications - No data to display  ED Course/ Medical Decision Making/ A&P   {   Click here for ABCD2, HEART and other calculatorsREFRESH Note before signing :1}                              Medical Decision Making  BP (!) 167/75   Pulse 92   Temp 98.1 F (36.7 C) (Oral)   Resp 20   Ht 5\' 11"  (1.803 m)   Wt 99.8 kg   SpO2 97%   BMI 30.68 kg/m   4:34 PM Patient presents to the emergency room for chief complain of persistent congestion, rhinorrhea and productive cough x 1 week. He reports the cough is worse on some days in comparison to others. He reports one episode of vomiting last night and describes the vomit as clear and watery. He has tried OTC measures such as theraflu, mucinex, halls, etc. at home. His symptoms are aggravated by laying down to go to sleep. He is a tobacco user, typically smoking 1 pack/day but has been smoking less due to his  cough and overall discomfort. He reports that his roommate had community acquired pneumonia recently, but has since resolved. Positive for cough, rhinorrhea, a little SOB, hoarseness, chills, and diaphoresis. Denies fever, hemoptysis, fever, weight loss.  Exam overall reassuring, heart with normal rate and rhythm, lungs are clear to auscultation bilaterally abdomen is soft nontender vital signs are notable for elevated blood pressure as 167/75.  Patient is afebrile no hypoxia.  Patient admits he did not take his  blood pressure medication today.  {Document critical care time when appropriate:1} {Document review of labs and clinical decision tools ie heart score, Chads2Vasc2 etc:1}  {Document your independent review of radiology images, and any outside records:1} {Document your discussion with family members, caretakers, and with consultants:1} {Document social determinants of health affecting pt's care:1} {Document your decision making why or why not admission, treatments were needed:1} Final Clinical Impression(s) / ED Diagnoses Final diagnoses:  None    Rx / DC Orders ED Discharge Orders     None

## 2023-11-18 NOTE — ED Triage Notes (Signed)
Pt cough and congestion x 1 week

## 2024-01-06 ENCOUNTER — Encounter (HOSPITAL_BASED_OUTPATIENT_CLINIC_OR_DEPARTMENT_OTHER): Payer: Self-pay | Admitting: Emergency Medicine

## 2024-01-06 ENCOUNTER — Other Ambulatory Visit: Payer: Self-pay

## 2024-01-06 ENCOUNTER — Emergency Department (HOSPITAL_BASED_OUTPATIENT_CLINIC_OR_DEPARTMENT_OTHER)
Admission: EM | Admit: 2024-01-06 | Discharge: 2024-01-06 | Disposition: A | Discharge status: Home / Self Care | Payer: 59 | Attending: Emergency Medicine | Admitting: Emergency Medicine

## 2024-01-06 DIAGNOSIS — M7989 Other specified soft tissue disorders: Secondary | ICD-10-CM | POA: Diagnosis not present

## 2024-01-06 DIAGNOSIS — R600 Localized edema: Secondary | ICD-10-CM

## 2024-01-06 DIAGNOSIS — J329 Chronic sinusitis, unspecified: Secondary | ICD-10-CM | POA: Insufficient documentation

## 2024-01-06 DIAGNOSIS — Z794 Long term (current) use of insulin: Secondary | ICD-10-CM | POA: Insufficient documentation

## 2024-01-06 DIAGNOSIS — R220 Localized swelling, mass and lump, head: Secondary | ICD-10-CM

## 2024-01-06 DIAGNOSIS — J019 Acute sinusitis, unspecified: Secondary | ICD-10-CM

## 2024-01-06 MED ORDER — FUROSEMIDE 20 MG PO TABS: 20.0000 mg | ORAL_TABLET | Freq: Every day | ORAL | 0 refills | Status: DC | PRN

## 2024-01-06 MED ORDER — DOXYCYCLINE HYCLATE 100 MG PO CAPS: 100.0000 mg | ORAL_CAPSULE | Freq: Two times a day (BID) | ORAL | 0 refills | Status: DC

## 2024-01-06 NOTE — ED Triage Notes (Signed)
 URI X 2 days with some facial and leg swelling.

## 2024-01-06 NOTE — Discharge Instructions (Signed)
 Begin taking doxycycline  as prescribed.  Begin taking Lasix  as prescribed as needed for leg swelling.  Elevate your feet at the end of the day and wear compression stockings if this does not help.  Follow-up with your primary doctor in the next 1 to 2 weeks.

## 2024-01-06 NOTE — ED Notes (Signed)
 ED Provider at bedside.

## 2024-01-06 NOTE — ED Provider Notes (Signed)
 [place] [facility] Provider Note   CSN: [id number] Arrival date & time: 01/06/24  9383     History  Chief Complaint  Patient presents with   URI   Leg Swelling    Erik Chung is a 53 y.o. male.  Patient is a 53 year old male presenting with complaints of URI symptoms for the past 2 days, now having swelling to the left cheek and periorbital tissues.  He describes some facial pain.  He has been coughing up occasional yellow sputum.  No fevers or chills no ill contacts.  He also reports some swelling of both lower extremities.  He has noted this after being on his feet for prolonged periods of time.  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  He denies any chest pain or difficulty breathing.  The history is provided by the patient.       Home Medications Prior to Admission medications   Medication Sig Start Date End Date Taking? Authorizing Provider  benzonatate  (TESSALON ) 100 MG capsule Take 1 capsule (100 mg total) by mouth every 8 (eight) hours. 11/18/23   Nivia Colon, PA-C  cefadroxil  (DURICEF) 500 MG capsule Take 1 capsule (500 mg total) by mouth 2 (two) times daily. 03/03/23   Silver Wonda LABOR, PA  doxycycline  (VIBRAMYCIN ) 50 MG capsule Take 2 capsules (100 mg total) by mouth 2 (two) times daily. 11/21/20   Kuzma, Kevin, MD  furosemide  (LASIX ) 20 MG tablet Take 1 tablet (20 mg total) by mouth daily. 03/03/23   Silver Wonda LABOR, PA  gabapentin  (NEURONTIN ) 300 MG capsule Take 1 capsule (300 mg total) by mouth 2 (two) times daily for 7 days. 02/08/23 02/15/23  Redwine, Madison A, PA-C  gemfibrozil  (LOPID ) 600 MG tablet Take 1 tablet (600 mg total) by mouth 2 (two) times daily before a meal. 02/26/14   Odell Celinda Balo, MD  HYDROcodone -acetaminophen  [facility]) 5-325 MG tablet 1-2 tabs po q6 hours prn pain 11/21/20   Kuzma, Kevin, MD  Insulin  Aspart FlexPen (NOVOLOG ) 100 UNIT/ML Inject 30 Units into the skin 2 (two) times  daily. 02/08/23 03/10/23  Redwine, Madison A, PA-C  insulin  aspart protamine- aspart (NOVOLOG  MIX 70/30) (70-30) 100 UNIT/ML injection Inject 30-40 Units into the skin See admin instructions. Take 40 units in the morning and 30 units at bedtime    [provider]  insulin  glargine (LANTUS ) 100 UNIT/ML injection Inject into the skin.    [provider]  insulin  lispro (HUMALOG) 100 UNIT/ML KwikPen Inject into the skin. 07/08/20   [provider]  lisinopril  (ZESTRIL ) 10 MG tablet Take 1 tablet (10 mg total) by mouth daily. 02/08/23   Redwine, Madison A, PA-C  metFORMIN  (GLUCOPHAGE ) 500 MG tablet Take 2 tablets (1,000 mg total) by mouth 2 (two) times daily with a meal. 02/08/23   Redwine, Madison A, PA-C  ondansetron  (ZOFRAN ) 4 MG tablet Take 1 tablet (4 mg total) by mouth every 6 (six) hours. 06/10/23   Victor Lynwood DASEN, PA-C      Allergies    Shellfish allergy and Penicillins    Review of Systems   Review of Systems  All other systems reviewed and are negative.   Physical Exam Updated Vital Signs BP (!) 195/98 (BP Location: Right Arm)   Pulse (!) 101   Temp 98.1 F (36.7 C) (Oral)   Resp 18   Ht 5' 10.5 (1.791 m)   Wt 99.8 kg   SpO2  94%   BMI 31.12 kg/m  Physical Exam Vitals and nursing note reviewed.  Constitutional:      General: He is not in acute distress.    Appearance: He is well-developed. He is not diaphoretic.  HENT:     Head: Normocephalic and atraumatic.     Comments: There is some swelling and tenderness noted to the left cheek and periorbital tissues of the left eye.    Right Ear: Tympanic membrane normal.     Left Ear: Tympanic membrane normal.     Mouth/Throat:     Mouth: Mucous membranes are moist.     Pharynx: Posterior oropharyngeal erythema present. No oropharyngeal exudate.  Cardiovascular:     Rate and Rhythm: Normal rate and regular rhythm.     Heart sounds: No murmur heard.    No friction rub.  Pulmonary:     Effort:  Pulmonary effort is normal. No respiratory distress.     Breath sounds: Normal breath sounds. No wheezing or rales.  Abdominal:     General: Bowel sounds are normal. There is no distension.     Palpations: Abdomen is soft.     Tenderness: There is no abdominal tenderness.  Musculoskeletal:        General: Normal range of motion.     Cervical back: Normal range of motion and neck supple.     Right lower leg: Edema present.     Left lower leg: Edema present.     Comments: There is trace to 1+ pitting edema both lower extremities.  There is no calf tenderness.  Skin:    General: Skin is warm and dry.  Neurological:     Mental Status: He is alert and oriented to person, place, and time.     Coordination: Coordination normal.     ED Results / Procedures / Treatments   Labs (all labs ordered are listed, but only abnormal results are displayed) Labs Reviewed - No data to display  EKG None  Radiology No results found.  Procedures Procedures    Medications Ordered in ED Medications - No data to display  ED Course/ Medical Decision Making/ A&P  Patient is resenting with sinus congestion and facial swelling consistent with sinusitis.  This to be treated with doxycycline  and decongestants.  He is also having some swelling of his extremities that I suspect is related to dependent edema from being on his feet all day.  I will recommend elevation at the end of the day and prescribed a low-dose of Lasix  and see if this helps.  Final Clinical Impression(s) / ED Diagnoses Final diagnoses:  None    Rx / DC Orders ED Discharge Orders     None         Geroldine Berg, MD 01/06/24 [phone]

## 2024-03-14 ENCOUNTER — Other Ambulatory Visit: Payer: Self-pay

## 2024-03-14 ENCOUNTER — Emergency Department (HOSPITAL_COMMUNITY): Payer: Self-pay

## 2024-03-14 ENCOUNTER — Encounter (HOSPITAL_COMMUNITY): Payer: Self-pay | Admitting: Internal Medicine

## 2024-03-14 ENCOUNTER — Inpatient Hospital Stay (HOSPITAL_COMMUNITY)
Admission: EM | Admit: 2024-03-14 | Discharge: 2024-03-16 | DRG: 291 | Disposition: A | Discharge status: Home / Self Care | Payer: Self-pay | Attending: Family Medicine | Admitting: Family Medicine

## 2024-03-14 DIAGNOSIS — M793 Panniculitis, unspecified: Secondary | ICD-10-CM | POA: Diagnosis present

## 2024-03-14 DIAGNOSIS — Z833 Family history of diabetes mellitus: Secondary | ICD-10-CM

## 2024-03-14 DIAGNOSIS — F1410 Cocaine abuse, uncomplicated: Secondary | ICD-10-CM | POA: Diagnosis present

## 2024-03-14 DIAGNOSIS — Z880 Allergy status to penicillin: Secondary | ICD-10-CM

## 2024-03-14 DIAGNOSIS — I10 Essential (primary) hypertension: Secondary | ICD-10-CM | POA: Diagnosis present

## 2024-03-14 DIAGNOSIS — Z794 Long term (current) use of insulin: Secondary | ICD-10-CM

## 2024-03-14 DIAGNOSIS — Z7984 Long term (current) use of oral hypoglycemic drugs: Secondary | ICD-10-CM

## 2024-03-14 DIAGNOSIS — F17210 Nicotine dependence, cigarettes, uncomplicated: Secondary | ICD-10-CM | POA: Diagnosis present

## 2024-03-14 DIAGNOSIS — Z91141 Patient's other noncompliance with medication regimen due to financial hardship: Secondary | ICD-10-CM

## 2024-03-14 DIAGNOSIS — I5033 Acute on chronic diastolic (congestive) heart failure: Secondary | ICD-10-CM | POA: Diagnosis present

## 2024-03-14 DIAGNOSIS — E785 Hyperlipidemia, unspecified: Secondary | ICD-10-CM | POA: Diagnosis present

## 2024-03-14 DIAGNOSIS — I509 Heart failure, unspecified: Secondary | ICD-10-CM

## 2024-03-14 DIAGNOSIS — I110 Hypertensive heart disease with heart failure: Principal | ICD-10-CM | POA: Diagnosis present

## 2024-03-14 DIAGNOSIS — M7989 Other specified soft tissue disorders: Principal | ICD-10-CM

## 2024-03-14 DIAGNOSIS — Z79899 Other long term (current) drug therapy: Secondary | ICD-10-CM

## 2024-03-14 DIAGNOSIS — E119 Type 2 diabetes mellitus without complications: Secondary | ICD-10-CM

## 2024-03-14 DIAGNOSIS — I890 Lymphedema, not elsewhere classified: Secondary | ICD-10-CM | POA: Diagnosis present

## 2024-03-14 DIAGNOSIS — Z5971 Insufficient health insurance coverage: Secondary | ICD-10-CM

## 2024-03-14 DIAGNOSIS — Z91013 Allergy to seafood: Secondary | ICD-10-CM

## 2024-03-14 DIAGNOSIS — E1165 Type 2 diabetes mellitus with hyperglycemia: Secondary | ICD-10-CM | POA: Diagnosis present

## 2024-03-14 LAB — CBC WITH DIFFERENTIAL/PLATELET
Abs Immature Granulocytes: 0.05 10*3/uL (ref 0.00–0.07)
Basophils Absolute: 0 10*3/uL (ref 0.0–0.1)
Basophils Relative: 0 %
Eosinophils Absolute: 0.1 10*3/uL (ref 0.0–0.5)
Eosinophils Relative: 1 %
HCT: 37.1 % — ABNORMAL LOW (ref 39.0–52.0)
Hemoglobin: 11.2 g/dL — ABNORMAL LOW (ref 13.0–17.0)
Immature Granulocytes: 1 %
Lymphocytes Relative: 16 %
Lymphs Abs: 1.5 10*3/uL (ref 0.7–4.0)
MCH: 24.3 pg — ABNORMAL LOW (ref 26.0–34.0)
MCHC: 30.2 g/dL (ref 30.0–36.0)
MCV: 80.7 fL (ref 80.0–100.0)
Monocytes Absolute: 0.8 10*3/uL (ref 0.1–1.0)
Monocytes Relative: 9 %
Neutro Abs: 6.6 10*3/uL (ref 1.7–7.7)
Neutrophils Relative %: 73 %
Platelets: 277 10*3/uL (ref 150–400)
RBC: 4.6 MIL/uL (ref 4.22–5.81)
RDW: 15.6 % — ABNORMAL HIGH (ref 11.5–15.5)
WBC: 9.1 10*3/uL (ref 4.0–10.5)
nRBC: 0 % (ref 0.0–0.2)

## 2024-03-14 LAB — URINALYSIS, ROUTINE W REFLEX MICROSCOPIC
Bacteria, UA: NONE SEEN
Bilirubin Urine: NEGATIVE
Glucose, UA: >=500 mg/dL — AB
Ketones, ur: NEGATIVE mg/dL
Leukocytes,Ua: NEGATIVE
Nitrite: NEGATIVE
Protein, ur: >=300 mg/dL — AB
Specific Gravity, Urine: 1.012 (ref 1.005–1.030)
pH: 5 (ref 5.0–8.0)

## 2024-03-14 LAB — COMPREHENSIVE METABOLIC PANEL
ALT: 18 U/L (ref 0–44)
AST: 19 U/L (ref 15–41)
Albumin: 2.4 g/dL — ABNORMAL LOW (ref 3.5–5.0)
Alkaline Phosphatase: 97 U/L (ref 38–126)
Anion gap: 7 (ref 5–15)
BUN: 20 mg/dL (ref 6–20)
CO2: 27 mmol/L (ref 22–32)
Calcium: 8 mg/dL — ABNORMAL LOW (ref 8.9–10.3)
Chloride: 99 mmol/L (ref 98–111)
Creatinine, Ser: 1.09 mg/dL (ref 0.61–1.24)
GFR, Estimated: >60 mL/min (ref >60)
Glucose, Bld: 340 mg/dL — ABNORMAL HIGH (ref 70–99)
Potassium: 3.7 mmol/L (ref 3.5–5.1)
Sodium: 133 mmol/L — ABNORMAL LOW (ref 135–145)
Total Bilirubin: 0.4 mg/dL (ref 0.0–1.2)
Total Protein: 6.2 g/dL — ABNORMAL LOW (ref 6.5–8.1)

## 2024-03-14 LAB — HEMOGLOBIN A1C
Hgb A1c MFr Bld: 12.8 % — ABNORMAL HIGH (ref 4.8–5.6)
Mean Plasma Glucose: 320.66 mg/dL

## 2024-03-14 LAB — GLUCOSE, CAPILLARY: Glucose-Capillary: 296 mg/dL — ABNORMAL HIGH (ref 70–99)

## 2024-03-14 LAB — BRAIN NATRIURETIC PEPTIDE: B Natriuretic Peptide: 817 pg/mL — ABNORMAL HIGH (ref 0.0–100.0)

## 2024-03-14 LAB — CBG MONITORING, ED: Glucose-Capillary: 326 mg/dL — ABNORMAL HIGH (ref 70–99)

## 2024-03-14 MED ORDER — FUROSEMIDE 10 MG/ML IJ SOLN
40.0000 mg | Freq: Once | INTRAMUSCULAR | Status: AC
  Administered 2024-03-14: 40 mg via INTRAVENOUS
  Filled 2024-03-14: qty 4

## 2024-03-14 MED ORDER — LOSARTAN POTASSIUM 50 MG PO TABS
25.0000 mg | ORAL_TABLET | Freq: Every day | ORAL | Status: DC
  Administered 2024-03-14 – 2024-03-16 (×3): 25 mg via ORAL
  Filled 2024-03-14 (×3): qty 1

## 2024-03-14 MED ORDER — INSULIN ASPART 100 UNIT/ML IJ SOLN
0.0000 [IU] | Freq: Every day | INTRAMUSCULAR | Status: DC
  Administered 2024-03-14: 3 [IU] via SUBCUTANEOUS
  Filled 2024-03-14: qty 0.05

## 2024-03-14 MED ORDER — INSULIN GLARGINE 100 UNIT/ML ~~LOC~~ SOLN
30.0000 [IU] | Freq: Every day | SUBCUTANEOUS | Status: DC
  Administered 2024-03-14 – 2024-03-15 (×2): 30 [IU] via SUBCUTANEOUS
  Filled 2024-03-14 (×3): qty 0.3

## 2024-03-14 MED ORDER — HYDRALAZINE HCL 50 MG PO TABS
50.0000 mg | ORAL_TABLET | Freq: Three times a day (TID) | ORAL | Status: DC
  Administered 2024-03-14 – 2024-03-16 (×6): 50 mg via ORAL
  Filled 2024-03-14 (×2): qty 1
  Filled 2024-03-14: qty 2
  Filled 2024-03-14 (×3): qty 1

## 2024-03-14 MED ORDER — INSULIN GLARGINE-YFGN 100 UNIT/ML ~~LOC~~ SOLN: 30.0000 [IU] | Freq: Every day | SUBCUTANEOUS | Status: DC

## 2024-03-14 MED ORDER — LABETALOL HCL 5 MG/ML IV SOLN
10.0000 mg | INTRAVENOUS | Status: DC | PRN
  Administered 2024-03-14: 10 mg via INTRAVENOUS
  Filled 2024-03-14: qty 4

## 2024-03-14 MED ORDER — INSULIN ASPART 100 UNIT/ML IJ SOLN
0.0000 [IU] | Freq: Three times a day (TID) | INTRAMUSCULAR | Status: DC
  Administered 2024-03-15: 5 [IU] via SUBCUTANEOUS
  Administered 2024-03-15 – 2024-03-16 (×3): 3 [IU] via SUBCUTANEOUS
  Administered 2024-03-16: 2 [IU] via SUBCUTANEOUS
  Filled 2024-03-14: qty 0.15

## 2024-03-14 MED ORDER — HYDRALAZINE HCL 25 MG PO TABS: 25.0000 mg | ORAL_TABLET | ORAL | Status: DC | PRN

## 2024-03-14 MED ORDER — ENOXAPARIN SODIUM 40 MG/0.4ML IJ SOSY
40.0000 mg | PREFILLED_SYRINGE | INTRAMUSCULAR | Status: DC
  Administered 2024-03-14 – 2024-03-15 (×2): 40 mg via SUBCUTANEOUS
  Filled 2024-03-14 (×2): qty 0.4

## 2024-03-14 MED ORDER — ACETAMINOPHEN 325 MG PO TABS
650.0000 mg | ORAL_TABLET | Freq: Four times a day (QID) | ORAL | Status: DC | PRN
  Administered 2024-03-14 – 2024-03-16 (×3): 650 mg via ORAL
  Filled 2024-03-14 (×3): qty 2

## 2024-03-14 MED ORDER — FUROSEMIDE 10 MG/ML IJ SOLN
20.0000 mg | Freq: Two times a day (BID) | INTRAMUSCULAR | Status: DC
  Administered 2024-03-15: 20 mg via INTRAVENOUS
  Filled 2024-03-14: qty 2

## 2024-03-14 MED ORDER — ACETAMINOPHEN 650 MG RE SUPP: 650.0000 mg | Freq: Four times a day (QID) | RECTAL | Status: DC | PRN

## 2024-03-14 NOTE — ED Triage Notes (Signed)
 Pt states "my legs have been swelling and I have some open spots on the back of my legs now"  Pt states he does not have insurance and unable to get his medications "since they were prescribed"

## 2024-03-14 NOTE — H&P (Signed)
 History and Physical    Erik Chung WUJ:[id number] DOB: 1971/07/31 DOA: 03/14/2024  PCP: Patient, No Pcp Per  Patient coming from: Home  I have personally briefly reviewed patient's old medical records available.   Chief Complaint: Leg swollen, short of breath for long time but worse for the last few weeks.  HPI: Erik Chung is a 53 y.o. male with medical history significant of essential hypertension, currently untreated and type 2 diabetes currently untreated complaints of being short of breath and new onset leg swelling for about 2 weeks.  According the patient, he is struggling to get prescriptions filled that were prescribed from ER and rationing some medications.  He has not taken any insulin for at least 2 months and he has been intermittently taking blood pressure medication.  His blood sugars are always high, does not monitor at home.  He started noticing leg swelling about 2 weeks now.  Feels heavy.  He also noticed some abdominal wall swelling.  Patient is still trying to maintain 2 jobs, he however complains of being occasional shortness of breath even at rest.  Denies any chest pain.  His appetite is fair.  He eats regular diet.  Urine and bowel habits are normal. ED Course: Blood pressures elevated 187/103.  Creatinine normal.  Potassium is normal.  Glucose is 326.  BNP 817.  Chest x-ray is poor quality due to body habitus but does not have any evidence of pleural effusion or cardiomegaly. Patient was given a dose of Lasix 40 mg IV in the ER and admission requested to manage and make long-term plan for the patient.  Patient tells me that if he can get cheaper options medications he can afford it.  Review of Systems: all systems are reviewed and pertinent positive as per HPI otherwise rest are negative.     Past Medical History:  Diagnosis Date   Diabetes mellitus    Hypertension    No pertinent past medical history     Past Surgical History:  Procedure  Laterality Date   HAND SURGERY     INCISION AND DRAINAGE OF WOUND Right 11/20/2020   Procedure: IRRIGATION AND DEBRIDEMENT RIGHT LONG FINGER;  Surgeon: Betha Loa, MD;  Location: [facility] OR;  Service: Orthopedics;  Laterality: Right;    Social history   reports that he has been smoking cigarettes. He has never used smokeless tobacco. He reports current alcohol use of about 3.0 standard drinks of alcohol per week. He reports current drug use. Drug: Marijuana.  Patient tells me that he does not use drugs but is snorted some cocaine last night.  Allergies  Allergen Reactions   Shellfish Allergy Anaphylaxis   Penicillins Nausea And Vomiting    Family History  Problem Relation Age of Onset   Diabetes Mother    Diabetes Father      Prior to Admission medications   Medication Sig Start Date End Date Taking? Authorizing Provider  benzonatate (TESSALON) 100 MG capsule Take 1 capsule (100 mg total) by mouth every 8 (eight) hours. 11/18/23   Fayrene Helper, PA-C  cefadroxil (DURICEF) 500 MG capsule Take 1 capsule (500 mg total) by mouth 2 (two) times daily. 03/03/23   Peter Garter, PA  doxycycline (VIBRAMYCIN) 100 MG capsule Take 1 capsule (100 mg total) by mouth 2 (two) times daily. One po bid x 7 days 01/06/24   Geoffery Lyons, MD  furosemide (LASIX) 20 MG tablet Take 1 tablet (20 mg total) by mouth daily as needed. 01/06/24  Geoffery Lyons, MD  gabapentin (NEURONTIN) 300 MG capsule Take 1 capsule (300 mg total) by mouth 2 (two) times daily for 7 days. 02/08/23 02/15/23  Redwine, Madison A, PA-C  gemfibrozil (LOPID) 600 MG tablet Take 1 tablet (600 mg total) by mouth 2 (two) times daily before a meal. 02/26/14   Marinda Elk, MD  HYDROcodone-acetaminophen [facility]) 5-325 MG tablet 1-2 tabs po q6 hours prn pain 11/21/20   Betha Loa, MD  Insulin Aspart FlexPen (NOVOLOG) 100 UNIT/ML Inject 30 Units into the skin 2 (two) times daily. 02/08/23 03/10/23  Redwine, Madison A, PA-C  insulin aspart  protamine- aspart (NOVOLOG MIX 70/30) (70-30) 100 UNIT/ML injection Inject 30-40 Units into the skin See admin instructions. Take 40 units in the morning and 30 units at bedtime    [provider]  insulin glargine (LANTUS) 100 UNIT/ML injection Inject into the skin.    [provider]  insulin lispro (HUMALOG) 100 UNIT/ML KwikPen Inject into the skin. 07/08/20   [provider]  lisinopril (ZESTRIL) 10 MG tablet Take 1 tablet (10 mg total) by mouth daily. 02/08/23   Redwine, Madison A, PA-C  metFORMIN (GLUCOPHAGE) 500 MG tablet Take 2 tablets (1,000 mg total) by mouth 2 (two) times daily with a meal. 02/08/23   Redwine, Madison A, PA-C  ondansetron (ZOFRAN) 4 MG tablet Take 1 tablet (4 mg total) by mouth every 6 (six) hours. 06/10/23   Netta Corrigan, PA-C    Physical Exam: Vitals:   03/14/24 1508 03/14/24 1514 03/14/24 1815  BP: (!) 173/100  (!) 187/103  Pulse: (!) 102  94  Resp: 18  19  Temp: 98.7 F (37.1 C)  98.3 F (36.8 C)  TempSrc: Oral  Oral  SpO2: 98%  99%  Weight:  102.1 kg   Height:  5' 10.5" (1.791 m)     Constitutional: NAD, calm, comfortable.  Eating.  On room air.  Able to talk in complete sentences. Vitals:   03/14/24 1508 03/14/24 1514 03/14/24 1815  BP: (!) 173/100  (!) 187/103  Pulse: (!) 102  94  Resp: 18  19  Temp: 98.7 F (37.1 C)  98.3 F (36.8 C)  TempSrc: Oral  Oral  SpO2: 98%  99%  Weight:  102.1 kg   Height:  5' 10.5" (1.791 m)    Eyes: PERRL, lids and conjunctivae normal ENMT: Mucous membranes are moist. Posterior pharynx clear of any exudate or lesions.Normal dentition.  Neck: normal, supple, no masses, no thyromegaly.  No JVD appreciated. Respiratory: some expiratory crackles at bases.  On room air.  Normal work of breathing.   Cardiovascular: Regular rate and rhythm, no murmurs / rubs / gallops.  He has some indurated edema on the lower abdominal wall.  He also has bilateral leg edema, tense.  Nontender. Poorly kept  extremities, dry flaky skin.  He has skin tag on the plantar aspect of the right foot without obvious infection. Abdomen: no tenderness, no masses palpated. No hepatosplenomegaly. Bowel sounds positive.  Obese. Musculoskeletal: no clubbing / cyanosis. No joint deformity upper and lower extremities. Good ROM, no contractures. Normal muscle tone.  Skin: no rashes, lesions, ulcers. No induration Neurologic: CN 2-12 grossly intact. Sensation intact, DTR normal. Strength 5/5 in all 4.  Psychiatric: Normal judgment and insight. Alert and oriented x 3. Normal mood.     Labs on Admission: I have personally reviewed following labs and imaging studies  CBC: Recent Labs  Lab 03/14/24 1519  WBC 9.1  NEUTROABS 6.6  HGB 11.2*  HCT 37.1*  MCV 80.7  PLT 277   Basic Metabolic Panel: Recent Labs  Lab 03/14/24 1519  NA 133*  K 3.7  CL 99  CO2 27  GLUCOSE 340*  BUN 20  CREATININE 1.09  CALCIUM 8.0*   GFR: Estimated Creatinine Clearance: 97.9 mL/min (by C-G formula based on SCr of 1.09 mg/dL). Liver Function Tests: Recent Labs  Lab 03/14/24 1519  AST 19  ALT 18  ALKPHOS 97  BILITOT 0.4  PROT 6.2*  ALBUMIN 2.4*   No results for input(s): "LIPASE", "AMYLASE" in the last 168 hours. No results for input(s): "AMMONIA" in the last 168 hours. Coagulation Profile: No results for input(s): "INR", "PROTIME" in the last 168 hours. Cardiac Enzymes: No results for input(s): "CKTOTAL", "CKMB", "CKMBINDEX", "TROPONINI" in the last 168 hours. BNP (last 3 results) No results for input(s): "PROBNP" in the last 8760 hours. HbA1C: No results for input(s): "HGBA1C" in the last 72 hours. CBG: Recent Labs  Lab 03/14/24 1520  GLUCAP 326*   Lipid Profile: No results for input(s): "CHOL", "HDL", "LDLCALC", "TRIG", "CHOLHDL", "LDLDIRECT" in the last 72 hours. Thyroid Function Tests: No results for input(s): "TSH", "T4TOTAL", "FREET4", "T3FREE", "THYROIDAB" in the last 72 hours. Anemia Panel: No  results for input(s): "VITAMINB12", "FOLATE", "FERRITIN", "TIBC", "IRON", "RETICCTPCT" in the last 72 hours. Urine analysis:    Component Value Date/Time   COLORURINE YELLOW 03/14/2024 1648   APPEARANCEUR CLEAR 03/14/2024 1648   LABSPEC 1.012 03/14/2024 1648   PHURINE 5.0 03/14/2024 1648   GLUCOSEU >=500 (A) 03/14/2024 1648   HGBUR MODERATE (A) 03/14/2024 1648   BILIRUBINUR NEGATIVE 03/14/2024 1648   KETONESUR NEGATIVE 03/14/2024 1648   PROTEINUR >=300 (A) 03/14/2024 1648   UROBILINOGEN 0.2 02/25/2014 0150   NITRITE NEGATIVE 03/14/2024 1648   LEUKOCYTESUR NEGATIVE 03/14/2024 1648    Radiological Exams on Admission: DG Chest Port 1 View Result Date: 03/14/2024 CLINICAL DATA:  Shortness of breath. EXAM: PORTABLE CHEST 1 VIEW COMPARISON:  November 18, 2023. FINDINGS: The heart size and mediastinal contours are within normal limits. Both lungs are clear. The visualized skeletal structures are unremarkable. IMPRESSION: No active disease. Electronically Signed   By: Lupita Raider M.D.   On: 03/14/2024 17:42    EKG: Independently reviewed.  Sinus rhythm.  No acute changes.  Assessment/Plan Principal Problem:   CHF exacerbation (HCC) Active Problems:   HTN (hypertension)   Diabetes mellitus (HCC)     Suspected new onset subacute congestive heart failure, likely diastolic. Untreated hypertension. Admitted, Lasix 40 mg IV given.  Started on Lasix 20 mg IV twice daily.  Currently hemodynamically stable. Start hydralazine and losartan.  Patient is not taking any home medications. Echocardiogram. Not starting on beta-blockers, used cocaine last night. Will need good control of blood pressures and diabetes.  2.  Type 2 diabetes, uncontrolled with hyperglycemia: Not using any medications including insulin for at least 2 months. Started on long-acting insulin 30 units, sliding scale insulin.  Check A1c.  Diabetic coordinator consult.  Patient will benefit with generic medications.  Needs  assistance with medications as well as PCP after discharge.  3.  Smoker, cocaine use disorder: Patient is stated to sporadic use of cocaine.  He states he quit his smoking yesterday.  Does not need nicotine patch.  He tells me he is not going to use cocaine again.  4.  Social: Apparently patient has been using ER for visits and medications.  Unable to afford medications prescribed  by ER.  Not going to PCP because of not having insurance coverage. Case management consulted to provide PCP info/may benefit with coming to [facility]. Please prescribe generic medications on discharge.    DVT prophylaxis: Lovenox subcu Code Status: Full code Family Communication: None at the bedside Disposition Plan: Home when stable Consults called: None Admission status: Observation, telemetry monitor.   Dorcas Carrow MD [facility]

## 2024-03-14 NOTE — ED Notes (Signed)
 Writer calling floor to notify patient would be coming up to them shortly.

## 2024-03-14 NOTE — ED Notes (Signed)
 Urinal left at beside patient aware urine sample is needed

## 2024-03-14 NOTE — ED Provider Notes (Signed)
  EMERGENCY DEPARTMENT AT [facility] Provider Note   CSN: [id number] Arrival date & time: 03/14/24  1501     History  Chief Complaint  Patient presents with   Leg Swelling    Erik Chung is a 53 y.o. male hx of HTN, diabetes, here presenting with shortness of breath and leg swelling.  Patient states that he does not have insurance currently.  He states that he is unable to afford his medications.  He states that he has worsening leg swelling and shortness of breath for about a week to 2.  He states that he has been trying to use compression stockings with no relief.  Patient states that his blood sugar is elevated because he could not get his medications either.  Denies any history of heart failure.  The history is provided by the patient.       Home Medications Prior to Admission medications   Medication Sig Start Date End Date Taking? Authorizing Provider  benzonatate (TESSALON) 100 MG capsule Take 1 capsule (100 mg total) by mouth every 8 (eight) hours. 11/18/23   Fayrene Helper, PA-C  cefadroxil (DURICEF) 500 MG capsule Take 1 capsule (500 mg total) by mouth 2 (two) times daily. 03/03/23   Peter Garter, PA  doxycycline (VIBRAMYCIN) 100 MG capsule Take 1 capsule (100 mg total) by mouth 2 (two) times daily. One po bid x 7 days 01/06/24   Geoffery Lyons, MD  furosemide (LASIX) 20 MG tablet Take 1 tablet (20 mg total) by mouth daily as needed. 01/06/24   Geoffery Lyons, MD  gabapentin (NEURONTIN) 300 MG capsule Take 1 capsule (300 mg total) by mouth 2 (two) times daily for 7 days. 02/08/23 02/15/23  Redwine, Madison A, PA-C  gemfibrozil (LOPID) 600 MG tablet Take 1 tablet (600 mg total) by mouth 2 (two) times daily before a meal. 02/26/14   Marinda Elk, MD  HYDROcodone-acetaminophen [facility]) 5-325 MG tablet 1-2 tabs po q6 hours prn pain 11/21/20   Betha Loa, MD  Insulin Aspart FlexPen (NOVOLOG) 100 UNIT/ML Inject 30 Units into the skin 2 (two) times  daily. 02/08/23 03/10/23  Redwine, Madison A, PA-C  insulin aspart protamine- aspart (NOVOLOG MIX 70/30) (70-30) 100 UNIT/ML injection Inject 30-40 Units into the skin See admin instructions. Take 40 units in the morning and 30 units at bedtime    [provider]  insulin glargine (LANTUS) 100 UNIT/ML injection Inject into the skin.    [provider]  insulin lispro (HUMALOG) 100 UNIT/ML KwikPen Inject into the skin. 07/08/20   [provider]  lisinopril (ZESTRIL) 10 MG tablet Take 1 tablet (10 mg total) by mouth daily. 02/08/23   Redwine, Madison A, PA-C  metFORMIN (GLUCOPHAGE) 500 MG tablet Take 2 tablets (1,000 mg total) by mouth 2 (two) times daily with a meal. 02/08/23   Redwine, Madison A, PA-C  ondansetron (ZOFRAN) 4 MG tablet Take 1 tablet (4 mg total) by mouth every 6 (six) hours. 06/10/23   Netta Corrigan, PA-C      Allergies    Shellfish allergy and Penicillins    Review of Systems   Review of Systems  Cardiovascular:  Positive for leg swelling.  All other systems reviewed and are negative.   Physical Exam Updated Vital Signs BP (!) 187/103 (BP Location: Right Arm)   Pulse 94   Temp 98.3 F (36.8 C) (Oral)   Resp 19   Ht 5' 10.5" (1.791 m)   Wt 102.1  kg   SpO2 99%   BMI 31.83 kg/m  Physical Exam Vitals and nursing note reviewed.  Constitutional:      Comments: Chronically ill-appearing  HENT:     Head: Normocephalic.     Nose: Nose normal.     Mouth/Throat:     Mouth: Mucous membranes are moist.  Eyes:     Extraocular Movements: Extraocular movements intact.     Pupils: Pupils are equal, round, and reactive to light.  Cardiovascular:     Rate and Rhythm: Normal rate and regular rhythm.     Pulses: Normal pulses.  Pulmonary:     Comments: Crackles bilateral bases Abdominal:     General: Abdomen is flat.     Palpations: Abdomen is soft.  Musculoskeletal:     Cervical back: Normal range of motion and neck supple.     Comments: 2+  edema bilaterally  Skin:    Capillary Refill: Capillary refill takes less than 2 seconds.  Neurological:     General: No focal deficit present.     Mental Status: He is oriented to person, place, and time.  Psychiatric:        Mood and Affect: Mood normal.        Behavior: Behavior normal.     ED Results / Procedures / Treatments   Labs (all labs ordered are listed, but only abnormal results are displayed) Labs Reviewed  CBC WITH DIFFERENTIAL/PLATELET - Abnormal; Notable for the following components:      Result Value   Hemoglobin 11.2 (*)    HCT 37.1 (*)    MCH 24.3 (*)    RDW 15.6 (*)    All other components within normal limits  COMPREHENSIVE METABOLIC PANEL - Abnormal; Notable for the following components:   Sodium 133 (*)    Glucose, Bld 340 (*)    Calcium 8.0 (*)    Total Protein 6.2 (*)    Albumin 2.4 (*)    All other components within normal limits  BRAIN NATRIURETIC PEPTIDE - Abnormal; Notable for the following components:   B Natriuretic Peptide 817.0 (*)    All other components within normal limits  CBG MONITORING, ED - Abnormal; Notable for the following components:   Glucose-Capillary 326 (*)    All other components within normal limits  URINALYSIS, ROUTINE W REFLEX MICROSCOPIC    EKG None  Radiology DG Chest Port 1 View Result Date: 03/14/2024 CLINICAL DATA:  Shortness of breath. EXAM: PORTABLE CHEST 1 VIEW COMPARISON:  November 18, 2023. FINDINGS: The heart size and mediastinal contours are within normal limits. Both lungs are clear. The visualized skeletal structures are unremarkable. IMPRESSION: No active disease. Electronically Signed   By: Lupita Raider M.D.   On: 03/14/2024 17:42    Procedures Procedures    Medications Ordered in ED Medications  furosemide (LASIX) injection 40 mg (40 mg Intravenous Given 03/14/24 1657)    ED Course/ Medical Decision Making/ A&P                                 Medical Decision Making Erik Chung  is a 53 y.o. male here presenting with leg swelling and shortness of breath.  Patient appears to be in heart failure.  Patient has crackles bilateral bases and appears volume overloaded.  I do not see any previous echo in our system.  Patient also does not have insurance.  Plan to get CBC  and CMP and BNP and chest x-ray and will give diuretics  6:24 PM Reviewed patient's labs and BNP is elevated at 800.  Chest x-ray is clear by my review, patient appears to have mild pulmonary edema.  Given Lasix in the ED.  Will admit for echo to rule out heart failure   Problems Addressed: Congestive heart failure, unspecified HF chronicity, unspecified heart failure type [facility]): undiagnosed new problem with uncertain prognosis Leg swelling: chronic illness or injury with exacerbation, progression, or side effects of treatment  Amount and/or Complexity of Data Reviewed Labs: ordered. Decision-making details documented in ED Course. Radiology: ordered and independent interpretation performed. Decision-making details documented in ED Course. ECG/medicine tests: ordered and independent interpretation performed. Decision-making details documented in ED Course.  Risk Prescription drug management. Decision regarding hospitalization.    Final Clinical Impression(s) / ED Diagnoses Final diagnoses:  None    Rx / DC Orders ED Discharge Orders     None         Charlynne Pander, MD 03/14/24 1826

## 2024-03-15 ENCOUNTER — Observation Stay (HOSPITAL_COMMUNITY): Payer: Self-pay

## 2024-03-15 ENCOUNTER — Telehealth: Payer: Self-pay | Admitting: General Practice

## 2024-03-15 ENCOUNTER — Encounter (HOSPITAL_COMMUNITY): Payer: Self-pay | Admitting: Internal Medicine

## 2024-03-15 DIAGNOSIS — Z880 Allergy status to penicillin: Secondary | ICD-10-CM | POA: Diagnosis not present

## 2024-03-15 DIAGNOSIS — E1165 Type 2 diabetes mellitus with hyperglycemia: Secondary | ICD-10-CM | POA: Diagnosis present

## 2024-03-15 DIAGNOSIS — E785 Hyperlipidemia, unspecified: Secondary | ICD-10-CM | POA: Diagnosis present

## 2024-03-15 DIAGNOSIS — R609 Edema, unspecified: Secondary | ICD-10-CM | POA: Diagnosis not present

## 2024-03-15 DIAGNOSIS — Z833 Family history of diabetes mellitus: Secondary | ICD-10-CM | POA: Diagnosis not present

## 2024-03-15 DIAGNOSIS — I110 Hypertensive heart disease with heart failure: Secondary | ICD-10-CM | POA: Diagnosis present

## 2024-03-15 DIAGNOSIS — M793 Panniculitis, unspecified: Secondary | ICD-10-CM | POA: Diagnosis present

## 2024-03-15 DIAGNOSIS — F17210 Nicotine dependence, cigarettes, uncomplicated: Secondary | ICD-10-CM | POA: Diagnosis present

## 2024-03-15 DIAGNOSIS — Z79899 Other long term (current) drug therapy: Secondary | ICD-10-CM | POA: Diagnosis not present

## 2024-03-15 DIAGNOSIS — Z91013 Allergy to seafood: Secondary | ICD-10-CM | POA: Diagnosis not present

## 2024-03-15 DIAGNOSIS — I5031 Acute diastolic (congestive) heart failure: Secondary | ICD-10-CM | POA: Diagnosis not present

## 2024-03-15 DIAGNOSIS — I890 Lymphedema, not elsewhere classified: Secondary | ICD-10-CM | POA: Diagnosis present

## 2024-03-15 DIAGNOSIS — Z7984 Long term (current) use of oral hypoglycemic drugs: Secondary | ICD-10-CM | POA: Diagnosis not present

## 2024-03-15 DIAGNOSIS — I509 Heart failure, unspecified: Secondary | ICD-10-CM

## 2024-03-15 DIAGNOSIS — F1410 Cocaine abuse, uncomplicated: Secondary | ICD-10-CM | POA: Diagnosis present

## 2024-03-15 DIAGNOSIS — Z91141 Patient's other noncompliance with medication regimen due to financial hardship: Secondary | ICD-10-CM | POA: Diagnosis not present

## 2024-03-15 DIAGNOSIS — M7989 Other specified soft tissue disorders: Secondary | ICD-10-CM | POA: Diagnosis present

## 2024-03-15 DIAGNOSIS — Z794 Long term (current) use of insulin: Secondary | ICD-10-CM | POA: Diagnosis not present

## 2024-03-15 DIAGNOSIS — Z5971 Insufficient health insurance coverage: Secondary | ICD-10-CM | POA: Diagnosis not present

## 2024-03-15 DIAGNOSIS — I5033 Acute on chronic diastolic (congestive) heart failure: Secondary | ICD-10-CM | POA: Diagnosis present

## 2024-03-15 LAB — COMPREHENSIVE METABOLIC PANEL
ALT: 16 U/L (ref 0–44)
AST: 11 U/L — ABNORMAL LOW (ref 15–41)
Albumin: 2.3 g/dL — ABNORMAL LOW (ref 3.5–5.0)
Alkaline Phosphatase: 78 U/L (ref 38–126)
Anion gap: 8 (ref 5–15)
BUN: 21 mg/dL — ABNORMAL HIGH (ref 6–20)
CO2: 27 mmol/L (ref 22–32)
Calcium: 8.2 mg/dL — ABNORMAL LOW (ref 8.9–10.3)
Chloride: 101 mmol/L (ref 98–111)
Creatinine, Ser: 1.17 mg/dL (ref 0.61–1.24)
GFR, Estimated: >60 mL/min (ref >60)
Glucose, Bld: 221 mg/dL — ABNORMAL HIGH (ref 70–99)
Potassium: 3.8 mmol/L (ref 3.5–5.1)
Sodium: 136 mmol/L (ref 135–145)
Total Bilirubin: 0.4 mg/dL (ref 0.0–1.2)
Total Protein: 5.8 g/dL — ABNORMAL LOW (ref 6.5–8.1)

## 2024-03-15 LAB — PROTEIN / CREATININE RATIO, URINE
Creatinine, Urine: 127 mg/dL
Protein Creatinine Ratio: 2.77 mg/mg{creat} — ABNORMAL HIGH (ref 0.00–0.15)
Total Protein, Urine: 352 mg/dL

## 2024-03-15 LAB — CBC
HCT: 39.1 % (ref 39.0–52.0)
Hemoglobin: 11.1 g/dL — ABNORMAL LOW (ref 13.0–17.0)
MCH: 23.6 pg — ABNORMAL LOW (ref 26.0–34.0)
MCHC: 28.4 g/dL — ABNORMAL LOW (ref 30.0–36.0)
MCV: 83 fL (ref 80.0–100.0)
Platelets: 268 10*3/uL (ref 150–400)
RBC: 4.71 MIL/uL (ref 4.22–5.81)
RDW: 15.6 % — ABNORMAL HIGH (ref 11.5–15.5)
WBC: 8.5 10*3/uL (ref 4.0–10.5)
nRBC: 0 % (ref 0.0–0.2)

## 2024-03-15 LAB — GLUCOSE, CAPILLARY
Glucose-Capillary: 211 mg/dL — ABNORMAL HIGH (ref 70–99)
Glucose-Capillary: 168 mg/dL — ABNORMAL HIGH (ref 70–99)
Glucose-Capillary: 172 mg/dL — ABNORMAL HIGH (ref 70–99)
Glucose-Capillary: 189 mg/dL — ABNORMAL HIGH (ref 70–99)

## 2024-03-15 LAB — ECHOCARDIOGRAM COMPLETE
AR max vel: 3.99 cm2
AV Area VTI: 3.74 cm2
AV Area mean vel: 3.75 cm2
AV Mean grad: 5 mmHg
AV Peak grad: 8.6 mmHg
Ao pk vel: 1.47 m/s
Area-P 1/2: 4.07 cm2
Calc EF: 51.7 %
Height: 70.5 in
S' Lateral: 3.25 cm
Single Plane A2C EF: 51.7 %
Single Plane A4C EF: 49.8 %
Weight: 3925.95 [oz_av]

## 2024-03-15 LAB — HIV ANTIBODY (ROUTINE TESTING W REFLEX): HIV Screen 4th Generation wRfx: NONREACTIVE

## 2024-03-15 MED ORDER — FUROSEMIDE 10 MG/ML IJ SOLN
40.0000 mg | Freq: Two times a day (BID) | INTRAMUSCULAR | Status: DC
  Administered 2024-03-15: 40 mg via INTRAVENOUS
  Filled 2024-03-15 (×2): qty 4

## 2024-03-15 MED ORDER — LABETALOL HCL 5 MG/ML IV SOLN
10.0000 mg | INTRAVENOUS | Status: AC | PRN
  Administered 2024-03-15: 10 mg via INTRAVENOUS
  Filled 2024-03-15: qty 4

## 2024-03-15 MED ORDER — DAPAGLIFLOZIN PROPANEDIOL 10 MG PO TABS
10.0000 mg | ORAL_TABLET | Freq: Every day | ORAL | Status: DC
  Administered 2024-03-15 – 2024-03-16 (×2): 10 mg via ORAL
  Filled 2024-03-15 (×2): qty 1

## 2024-03-15 NOTE — TOC Initial Note (Signed)
 Transition of Care (TOC) - Initial/Assessment Note  Patient works. Can,t get insurance until October per patient. Per patient Medicaid application in the mail. Resources for food and housing given to patient and added to AVS. Patients daughter will transport at discharge.  Patient Details  Name: Erik Chung MRN: [id number] Date of Birth: 02-17-1971  Transition of [facility]) CM/SW Contact:    Jessie Foot, RN Phone Number: 03/15/2024, 1:33 PM  Clinical Narrative:                         Patient Goals and CMS Choice            Expected Discharge Plan and Services                                              Prior Living Arrangements/Services                       Activities of Daily Living   ADL Screening (condition at time of admission) Independently performs ADLs?: Yes (appropriate for developmental age) Is the patient deaf or have difficulty hearing?: No Does the patient have difficulty seeing, even when wearing glasses/contacts?: No Does the patient have difficulty concentrating, remembering, or making decisions?: No  Permission Sought/Granted                  Emotional Assessment              Admission diagnosis:  Leg swelling [M79.89] CHF exacerbation (HCC) [I50.9] Congestive heart failure, unspecified HF chronicity, unspecified heart failure type [facility]) [I50.9] Patient Active Problem List   Diagnosis Date Noted   CHF exacerbation (HCC) 03/14/2024   No pertinent past medical history 12/07/2021   Acute pancreatitis 02/25/2014   Diabetes mellitus (HCC) 02/25/2014   HTN (hypertension) 01/18/2012   Hypokalemia 01/18/2012   Dehydration 01/18/2012   Diabetes mellitus, new onset (HCC) 01/16/2012   Diabetes with ketoacidosis (HCC) 01/16/2012   PCP:  Patient, No Pcp Per Pharmacy:   [id number] [place], [place] - [address] AT [facility] [address] [place] [facility]  [address] Phone: [phone] Fax: [phone]     Social Drivers of Health (SDOH) Social History: SDOH Screenings   Food Insecurity: Food Insecurity Present (03/14/2024)  Housing: High Risk (03/14/2024)  Transportation Needs: No Transportation Needs (03/14/2024)  Utilities: Not At Risk (03/14/2024)  Financial Resource Strain: Low Risk  (02/14/2023)   Received from [facility], [facility]  Social Connections: Unknown (02/09/2023)   Received from [facility], [facility]  Tobacco Use: High Risk (03/14/2024)   SDOH Interventions:     Readmission Risk Interventions     No data to display

## 2024-03-15 NOTE — Progress Notes (Signed)
   03/15/24 1258  TOC Brief Assessment  Insurance and Status Reviewed  Patient has primary care physician No  Home environment has been reviewed Lives with friend  Prior level of function: Independent  Prior/Current Home Services No current home services  Social Drivers of Health Review SDOH reviewed interventions complete (Resource given to patient)  Readmission risk has been reviewed Yes  Transition of care needs no transition of care needs at this time

## 2024-03-15 NOTE — Progress Notes (Signed)
 Bilateral lower extremity venous duplex has been completed. Preliminary results can be found in CV Proc through chart review.   03/15/24 11:08 AM Olen Cordial RVT

## 2024-03-15 NOTE — TOC Progression Note (Signed)
 Transition of Care [facility]) - Progression Note    Patient Details  Name: Erik Chung MRN: [id number] Date of Birth: [birth year]  Transition of Care [facility]) CM/SW Contact  Otelia Santee, LCSW Phone Number: 03/15/2024, 4:05 PM  Clinical Narrative:    [facility]. Earliest appointment is for 05/02/24 at 8am. Pt is also on waitlist to be called for an earlier appt date.  Information for [facility] in [place] also placed on pt's AVS to complete walk-in appointment.         Expected Discharge Plan and Services                                               Social Determinants of Health (SDOH) Interventions SDOH Screenings   Food Insecurity: Food Insecurity Present (03/14/2024)  Housing: High Risk (03/14/2024)  Transportation Needs: No Transportation Needs (03/14/2024)  Utilities: Not At Risk (03/14/2024)  Financial Resource Strain: Low Risk  (02/14/2023)   Received from [facility], [facility]  Social Connections: Unknown (02/09/2023)   Received from [facility], [facility]  Tobacco Use: High Risk (03/15/2024)    Readmission Risk Interventions     No data to display

## 2024-03-15 NOTE — Discharge Instructions (Signed)
 Resource for [employer] and Housing in [place] Area  EMERYWOOD BAPTIST CHURCH FOOD PANTRY provided by: EMERYWOOD [employer] FOOD PANTRY [address] [place], [place] Offers a Programme researcher, broadcasting/film/video. Food 2[facility] [place] provided by: [facility] [place] [address] [place], [place] Distributes food to [employer], such as food pantries, [employer], and health clinics. Food 3COMMUNITY FOOD DISTRIBUTION provided by: [facility] [place] [address], [place], [place] Offers pop-up drive-thru food pantries at different locations. Food 4CLEMMONS FOOD PANTRY provided by: [employer] FOOD PANTRY [address] [place], [place] Offers a food pantry with canned and dry goods, fresh produce, bread, and frozen meat. Offers dairy and diapers/baby wipes when available. Donor Services Food 5[place] FOOD PANTRY provided by: [employer] FOOD PANTRY [address] [place], [place], [place] Offers a Programme researcher, broadcasting/film/video. Food 6LABOR OF LOVE FOOD PANTRY provided by: LABOR OF LOVE FOOD PANTRY [address] [place], [place] Offers a food box with canned and [employer]. Also offers frozen food, fresh produce, and dairy when available. Food 7CENTRAL TRIAD CHURCH FOOD PANTRY provided by: CENTRAL TRIAD CHURCH FOOD PANTRY [address], [place], [place] Offers a Programme researcher, broadcasting/film/video. Emergency assistance may be available. Food SPRINGS Electronics engineer FOOD PANTRY provided by: [facility] FOOD PANTRY [address] [place], [place] Offers a Programme researcher, broadcasting/film/video. Donor Services Food 9[facility]CH provided by: Judeth Cornfield CHURCH [address], [place], [place] Offers a Programme researcher, broadcasting/film/video. Food 10BELOVED COMMUNITY FOOD PANTRY provided by: BELOVED COMMUNITY FOOD PANTRY [address], [place], [place] Offers a Programme researcher, broadcasting/film/video. Food  FOOD PANTRY provided by: Letitia Neri VIDA [address] [place], [place] Offers a Programme researcher, broadcasting/film/video. Food 12BREAD OF LIFE FOOD PANTRY provided by: Saul Fordyce [facility] [address] [place], [place] Offers a Programme researcher, broadcasting/film/video. Food 13FOOD PANTRY provided by: Sherrine Maples [facility] [address], [place], [place] Offers a Programme researcher, broadcasting/film/video. Donor Services Food 14THE ROCK CHURCH FOOD PANTRY provided by: THE [employer] FOOD PANTRY [address], [place], [place] Offers a Programme researcher, broadcasting/film/video and a community meal. Food  15FOOD ASSISTANCE provided by: Davene Costain - [place] [address] [place], [place] [employer]. Food 16FOOD PANTRY provided by: [employer] CHARITIES - Marcy Panning REGIONAL OFFICE [address], [place], [place] Offers a food pantry with non-perishable food, bread, and produce. Offers 3 - 5 days worth of food. Food 17WHOLE MAN FOOD PANTRY provided by: [employer] MINISTRIES [address] [place], [place] Offers a Radio broadcast assistant. Donor Services Food 18FOOD PANTRY provided by: [facility] [employer] [address] [place], [place] Offers a Programme researcher, broadcasting/film/video. Food Radiation protection practitioner provided by: [employer] OUTREACH MINISTRY [address] [place], [place] Offers a Programme researcher, broadcasting/film/video. Food Lockheed Martin AND SHARE FOOD PANTRY provided by: [facility] OF [facility] [address] [place], [place] Offers a food pantry with non-perishable items. Donor Services Food CLIENT CHOICE FOOD PANTRY provided by: [facility] MISSION [address] [place], [place] Offers a food pantry with canned and dry goods. Food 22CLIENT CHOICE FOOD PANTRY provided by: [facility] RESCUE MISSION [address], [place], [place] Offers a food pantry with canned and dry goods. Food 23CLIENT CHOICE FOOD  PANTRY provided by: Marcy Panning RESCUE MISSION [phone]  [place] [address] [place], [place] Offers a food pantry with canned and [employer]. Food 24SAINT Science [employer] International provided by: Ninetta Lights CME CHURCH [address] [place], [place] Offers a Programme researcher, broadcasting/film/video. Food 25[facility]S WE CARE HOUSE provided by: Rochele Raring WE CARE HOUSE [address] [place], [place] Offers a food pantry with non-perishable items and fresh produce from on-site garden. Donor Services Food 26HOPE OF Marcy Panning provided by: Riley Churches [address] [place], [place], [place] Offers a mobile food pantry and [employer] on fixed routes in [place]. Food 27FOOD ASSISTANCE provided by: CRISIS CONTROL MINISTRY [address] [place], [place] Offers 2 weeks of groceries with produce, meat, dairy, eggs, canned food, and toiletries. Food Material Goods 28FOOD ASSISTANCE provided by: CRISIS CONTROL MINISTRY [address] [place], [place] Offers 2 weeks of groceries with produce, meat, dairy, eggs, canned food, and toiletries. Food Material Goods 29FOOD PANTRY AND CLOTHING CLOSET provided by: [facility] [address], [place], [place] Offers a Programme researcher, broadcasting/film/video, Software engineer, and diapers. Donor Forensic scientist OUTREACH CENTER provided by: [facility] [phone] [place], [place], [place] Offers a food pantry with canned and dry goods. Also offers frozen meat, fresh produce, deli items, fresh bread, and desserts when available. Donor Services Food  FOOD PANTRY AND CLOTHING CLOSET provided by: Lorretta Harp CHURCH [address], [place], [place] Offers a Software engineer and drive thru food pantry. Also offers lunch meals when available. Donor Services Food Material Goods 32FOOD AND NUTRITION SERVICES provided by: DEPARTMENT OF SOCIAL SERVICES -  [facility] [address], [place], [place] Offers monthly benefits on an EBT card to help buy food. Benefit amount varies based on income and household size. Most grocery stores, farmers markets, and convenience stores accept EBT cards. EBT cards can also be used online to purchase eligible items at [place], [employer], [employer], [facility], [employer], [employer], [employer], [employer], and Morgan [employer]. Public Assistance Programs 33FOOD PANTRY provided by: [facility] [employer] [facility] [address] [place], [place] Offers a Programme researcher, broadcasting/film/video. May be able to help with utility bill. Limited funds available. Food Utilities 34CHRIST RESCUE TEMPLE APOSTOLIC CHURCH provided by: CHRIST RESCUE TEMPLE APOSTOLIC CHURCH [address], [place], [place] Offers a [employer], a Programme researcher, broadcasting/film/video, and a Software engineer. Food Material Goods 35OUR FATHER'S TABLE provided by: [employer] CHURCH [address], [place], [place] Offers a food pantry and Software engineer. Food Network engineer UNHEARD provided by: VOICES UNHEARD [address], [place], [place] Supports people and families in crisis with rent, food, and housing. Food Housing/Shelter 37SAMARITAN SOUP KITCHEN provided by: [employer] MINISTRIES [address] [place], [place] Offers a The [employer]. Donor Services Food Fortune Brands Lynd COOPERATIVE EXTENSION CENTER - [facility] provided by: [employer] COOPERATIVE EXTENSION CENTER - [facility] [address] [place], [place] Offers forestry management services and agricultural production information. Offers nutrition information workshops and food safety education. Also offers youth programs such as 4-H activities and clubs. Leisure centre manager Leisure Sports coach and Enrichment 39HOME  DELIVERED MEALS provided by: [facility] [address] [place], [place] Delivers meals to homebound older adults. Donor Services Food 40PROVIDENCE CULINARY TRAINING provided by: [facility] [place] [address] [place], [place] Offers Engineer, maintenance (IT) and [employer]. Employment NUTRITION SERVICES provided by: [facility] [place] [address] [place], [place] Offers a range of workshops, courses, and materials focused on eating healthy on a budget. Health Supportive Services 42TRIAD FOOD PANTRY provided by: TRIAD FOOD PANTRY [address] [place], [place] Offers a Programme researcher, broadcasting/film/video. PROGRAM provided by: [facility] [address], [place], [place] Offers a food pantry, Software engineer, and household items. Also offers financial help with prescription drugs, heating fuel, and electricity. Makes referrals to community resources. Limited funding available. Does not help with gas bill. Food Clinical research associate Goods Utilities 44RIDE UNITED LAST MILE DELIVERY provided by: [employer] OF [facility] [address], [place], [place] [employer]. Food 45CONGREGATE MEALS provided by: [employer] SERVICES - [facility] [address], [place], [place] Offers a congregate lunch meal for older adults. Food 46UNITYRISE provided by: [employer] [address], [place], [place] Offers mentorship and leadership training for youth.Also offers workshops on sustainable agriculture, local food programs, health and wellness promotion, and community gardens. Health Supportive Services Individual and Family Support Services Research 47SUNNYSIDE MINISTRY provided by: International [employer] MINISTRY [address] [place], [place] Offers financial help for rent, mortgage, and utilities. Offers a Programme researcher, broadcasting/film/video and a Software engineer. Does not offer help with  electric utility deposits. Food Housing/Shelter Material Goods Utilities 48ANIMAL ADOPTION AND RESCUE FOUNDATION provided by: [facility] RESC[facility]ATION [address] [place], [place] Offers adoptable dogs and cats. Some animals may also be available to foster. Offers a limited supply of pet food in emergency situations. Domestic Civil engineer, contracting [employer] CHARITIES OF [facility] provided by: [facility] CHARITIES OF [facility] [address], [place], [place] Offers lodging for families of children traveling for medical treatment or care. Donor Services Health Supportive Services 50PARENT/TEEN SOLUTIONS provided by: THE PARENTING PATH [address] [place], [place] Home visitation services for parents and teens. Teaches healthy communication, relationships, and topics relevant to teens. Donor Services Individual and Family Support Services Mental Health Assessment and Treatment  SENIOR LUNCH PROGRAM provided by: [facility] [address], [place], [place] Offers a congregate lunch meal for older adults. Offers socialization, recreational activities, and education programs. Food 52CONGREGATE MEALS provided by: [facility] [address] [place], [place] Offers a congregate lunch meal for older adults. Food 53SENIOR LUNCH PROGRAM provided by: [facility] [address], [place], [place] Offers a congregate lunch meal for older adults. Offers socialization, recreational activities, and education programs. Food 54COMMUNITY MEAL provided by: THE DWELLING [address], [place], [place] Offers a meal for the community. Food 55SAMARITAN INN SHELTER provided by: [employer] MINISTRIES [address] [place], [place] Offers temporary shelter for men experiencing homelessness. Shelter is available for up to 90 days. Residents  receive information and referrals to other community resources to help with medical needs, job training, housing, and more. Donor Services Housing/Shelter 56SENIOR LUNCH PROGRAM provided by: [facility] [address] [place]. [address], [place], [place] Offers a congregate lunch meal for older adults. Offers socialization, recreational activities, and education programs. Food 57HOME DELIVERED MEALS provided by: SENIOR RESOURCES - [facility] [address], [place], [place] Delivers meals to homebound older adults. Food [employer] PROGRAM provided by: [facility] [address] [place], [place] Offers a  congregate lunch meal for older adults. Offers socialization, recreational activities, and education programs. Food 59CONGREGATE MEALS provided by: [facility] [address] [place], [place] Offers a congregate lunch meal for older adults. Food [employer] SOC[employer]Y - [place] provided by: [employer] - [facility] [address] [place], [place] Offers an Furniture conservator/restorer with adoptable dogs and cats. Some animals may also be available to foster. Also offers low-cost spay/neuter services and a limited supply of pet food in emergency situations. Domestic Hotel manager SERVICES provided by: [employer] OF [place] [address] [place], [place] [employer]Mc[employer] and pharmacy pick-up and delivery. Also offers small errands service by request. Food Individual and Family Support Services 62EQUIPMENT AND HOME HEALTH SUPPLIES provided by: [facility] [address], [place], [place] Offers medical equipment and home health supplies to cancer patients. Supplies include nutritional supplements, ostomy supplies, and laryngectomy supplies. Equipment offered includes hospital beds, wheelchairs, bed pans, disposable diapers, and more. Also offers wigs, hats, and turbans. Donor Systems analyst  Supportive Services    63FOOD AND NUTRITION SERVICES EMPLOYMENT AND TRAINING PROGRAM provided by: DEPARTMENT OF SOCIAL SERVICES - [facility] [address], [place], [place] Offers a job Research scientist (physical sciences) for current FNS/Food Stamps recipients. May also be able to help with job placement. Employment 64A SHARED BLESSING FOOD PANTRY provided by: A SHARED BLESSING FOOD PANTRY [address], [place], [place] Offers a Programme researcher, broadcasting/film/video. Food MARKETS provided by: [employer] FOOD SHUTTLE

## 2024-03-15 NOTE — Progress Notes (Addendum)
 PROGRESS NOTE    Erik Chung  UEA:[id number] DOB: Feb 08, 1971 DOA: 03/14/2024 PCP: Patient, No Pcp Per  Chief Complaint  Patient presents with   Leg Swelling    Brief Narrative:    53 y.o. male with medical history significant of essential hypertension, currently untreated and type 2 diabetes currently untreated complaints of being short of breath and new onset leg swelling for about 2 weeks.   Being treated for HF exacerbation.  Assessment & Plan:   Principal Problem:   CHF exacerbation (HCC) Active Problems:   HTN (hypertension)   Diabetes mellitus (HCC)   Heart failure (HCC)  Acute Diastolic Heart Failure Exacerbation C/o SOB, mostly at night - sounds c/w orthopnea/PND CXR without active disease No ascites on US Echo with EF 55-60%, severe concentric LVH, trivial pericardial effusion - IVC dilated with >50% resp variability LE [place] without DVT Continued edema today, will continue lasix 40 mg BID Will add farxiga He'll need outpatient cardiology follow up, per echo report, consider cMRI to evaluate LVH (will defer to cards outpatient)  Type 2 Diabetes  Uncontrolled with Hyperglycemia A1c 12.8 Hasn't been taking any medicines due to cost At discharge consider 70/30 insulin from [employer] Started on farxiga as noted above.  Would recommend metformin at discharge as well.  Hypertension Follow with diuresis Will need additional antihypertensives at discharge Losartan, hydralazine Trend and adjust prn  Dyslipidemia Follow lipid panel in AM (attention to triglycerides, was on gemfibrozil in past)  Tobacco Abuse Encourage cessation  Hx Cocaine Abuse Encourage cessation - avoid beta blockers  Not taking any meds at home due to cost.  TOC c/s to help.  He'll need PCP follow up as well.      DVT prophylaxis: lovenox Code Status: full Family Communication: none Disposition:   Status is: Inpatient Remains inpatient appropriate because: need for continued  diuresis   Consultants:  none  Procedures:  Echo IMPRESSIONS 1. Left ventricular ejection fraction, by estimation, is 55 to 60% . The left ventricle has normal function. The left ventricle has no regional wall motion abnormalities. There is severe concentric left ventricular hypertrophy. Left ventricular diastolic parameters were normal. The average left ventricular global longitudinal strain is - 17. 3 % . The global longitudinal strain is normal. 2. Right ventricular systolic function is normal. The right ventricular size is normal. Mildly increased right ventricular wall thickness. There is normal pulmonary artery systolic pressure. The estimated right ventricular systolic pressure is 30. 7 mmHg. 3. Trivial pericardial effusion is present. There is no evidence of cardiac tamponade. 4. The mitral valve is degenerative. Trivial mitral valve regurgitation. No evidence of mitral stenosis. 5. The aortic valve is tricuspid. Aortic valve regurgitation is mild. No aortic stenosis is present. 6. The inferior vena cava is dilated in size with > 50% respiratory variability, suggesting right atrial pressure of 8 mmHg.  LE [place] Summary:  RIGHT:      - There is no evidence of deep vein thrombosis in the lower extremity.  However, portions of this examination were limited- see technologist  comments above.    - No cystic structure found in the popliteal fossa.    LEFT:      - There is no evidence of deep vein thrombosis in the lower extremity.  However, portions of this examination were limited- see technologist  comments above.    - No cystic structure found in the popliteal fossa.     Antimicrobials:  Anti-infectives (From admission, onward)    None  Subjective: C/o SOB mostly at night when he's trying to sleep  Objective: Vitals:   03/15/24 0100 03/15/24 0500 03/15/24 0507 03/15/24 1207  BP: (!) 143/79  122/64 (!) 170/95  Pulse: 86  82 88  Resp:   18 16  Temp: 98.7 F  (37.1 C)  98.1 F (36.7 C) 98.1 F (36.7 C)  TempSrc: Oral     SpO2: 94%  94% 97%  Weight:  111.3 kg    Height:        Intake/Output Summary (Last 24 hours) at 03/15/2024 1904 Last data filed at 03/15/2024 0500 Gross per 24 hour  Intake --  Output 500 ml  Net -500 ml   Filed Weights   03/14/24 1514 03/15/24 0500  Weight: 102.1 kg 111.3 kg    Examination:  General exam: Appears calm and comfortable  Respiratory system: Clear to auscultation. Respiratory effort normal. Cardiovascular system: RRR Gastrointestinal system: distended Central nervous system: Alert and oriented. No focal neurological deficits. Extremities: bilateral LE edema 1+    Data Reviewed: I have personally reviewed following labs and imaging studies  CBC: Recent Labs  Lab 03/14/24 1519 03/15/24 0837  WBC 9.1 8.5  NEUTROABS 6.6  --   HGB 11.2* 11.1*  HCT 37.1* 39.1  MCV 80.7 83.0  PLT 277 268    Basic Metabolic Panel: Recent Labs  Lab 03/14/24 1519 03/15/24 0837  NA 133* 136  K 3.7 3.8  CL 99 101  CO2 27 27  GLUCOSE 340* 221*  BUN 20 21*  CREATININE 1.09 1.17  CALCIUM 8.0* 8.2*    GFR: Estimated Creatinine Clearance: 95.1 mL/min (by C-G formula based on SCr of 1.17 mg/dL).  Liver Function Tests: Recent Labs  Lab 03/14/24 1519 03/15/24 0837  AST 19 11*  ALT 18 16  ALKPHOS 97 78  BILITOT 0.4 0.4  PROT 6.2* 5.8*  ALBUMIN 2.4* 2.3*    CBG: Recent Labs  Lab 03/14/24 1520 03/14/24 2102 03/15/24 0752 03/15/24 1205 03/15/24 1625  GLUCAP 326* 296* 211* 168* 172*     No results found for this or any previous visit (from the past 240 hours).       Radiology Studies: [place] ASCITES (ABDOMEN LIMITED) Result Date: 03/15/2024 CLINICAL DATA:  Abdominal distension EXAM: LIMITED ABDOMEN ULTRASOUND FOR ASCITES TECHNIQUE: Limited ultrasound survey for ascites was performed in all four abdominal quadrants. COMPARISON:  None Available. FINDINGS: Assessment of the 4 quadrants  demonstrates no evidence of ascites. IMPRESSION: 1. No ascites identified to account for the patient's reported abdominal distension. Electronically Signed   By: Gaylyn Rong M.D.   On: 03/15/2024 18:03   ECHOCARDIOGRAM COMPLETE Result Date: 03/15/2024    ECHOCARDIOGRAM REPORT   Patient Name:   Erik Chung Date of Exam: 03/15/2024 Medical Rec #:  [id number]           Height:       70.5 in Accession #:    [id number]          Weight:       245.4 lb Date of Birth:  October 26, 1971          BSA:          2.289 m Patient Age:    52 years            BP:           122/64 mmHg Patient Gender: M  HR:           91 bpm. Exam Location:  Inpatient Procedure: 2D Echo, Cardiac Doppler, Color Doppler and Strain Analysis (Both            Spectral and Color Flow Doppler were utilized during procedure). Indications:    I50.40* Unspecified combined systolic (congestive) and diastolic                 (congestive) heart failure  History:        Patient has no prior history of Echocardiogram examinations.                 CHF; Risk Factors:Hypertension and Diabetes.  Sonographer:    Sheralyn Boatman RDCS Referring Phys: [id number] [facility] IMPRESSIONS  1. Left ventricular ejection fraction, by estimation, is 55 to 60%. The left ventricle has normal function. The left ventricle has no regional wall motion abnormalities. There is severe concentric left ventricular hypertrophy. Left ventricular diastolic  parameters were normal. The average left ventricular global longitudinal strain is -17.3 %. The global longitudinal strain is normal.  2. Right ventricular systolic function is normal. The right ventricular size is normal. Mildly increased right ventricular wall thickness. There is normal pulmonary artery systolic pressure. The estimated right ventricular systolic pressure is 30.7 mmHg.  3. Trivial pericardial effusion is present. There is no evidence of cardiac tamponade.  4. The mitral valve is degenerative. Trivial  mitral valve regurgitation. No evidence of mitral stenosis.  5. The aortic valve is tricuspid. Aortic valve regurgitation is mild. No aortic stenosis is present.  6. The inferior vena cava is dilated in size with >50% respiratory variability, suggesting right atrial pressure of 8 mmHg. Comparison(s): No prior Echocardiogram. Conclusion(s)/Recommendation(s): Consider cMRI to further evaluate the left ventricular hypertrophy, if clinically indicated. FINDINGS  Left Ventricle: Left ventricular ejection fraction, by estimation, is 55 to 60%. The left ventricle has normal function. The left ventricle has no regional wall motion abnormalities. The average left ventricular global longitudinal strain is -17.3 %. Strain was performed and the global longitudinal strain is normal. The left ventricular internal cavity size was normal in size. There is severe concentric left ventricular hypertrophy. Left ventricular diastolic parameters were normal. Right Ventricle: The right ventricular size is normal. Mildly increased right ventricular wall thickness. Right ventricular systolic function is normal. There is normal pulmonary artery systolic pressure. The tricuspid regurgitant velocity is 2.38 m/s, and with an assumed right atrial pressure of 8 mmHg, the estimated right ventricular systolic pressure is 30.7 mmHg. Left Atrium: Left atrial size was normal in size. Right Atrium: Right atrial size was normal in size. Pericardium: Trivial pericardial effusion is present. There is no evidence of cardiac tamponade. Mitral Valve: The mitral valve is degenerative in appearance. Trivial mitral valve regurgitation. No evidence of mitral valve stenosis. Tricuspid Valve: The tricuspid valve is normal in structure. Tricuspid valve regurgitation is mild . No evidence of tricuspid stenosis. Aortic Valve: The aortic valve is tricuspid. Aortic valve regurgitation is mild. No aortic stenosis is present. Aortic valve mean gradient measures 5.0 mmHg.  Aortic valve peak gradient measures 8.6 mmHg. Aortic valve area, by VTI measures 3.74 cm. Pulmonic Valve: The pulmonic valve was normal in structure. Pulmonic valve regurgitation is not visualized. No evidence of pulmonic stenosis. Aorta: The aortic root and ascending aorta are structurally normal, with no evidence of dilitation. Venous: The inferior vena cava is dilated in size with greater than 50% respiratory variability, suggesting right atrial pressure of 8  mmHg. IAS/Shunts: The interatrial septum was not well visualized.  LEFT VENTRICLE PLAX 2D LVIDd:         4.70 cm     Diastology LVIDs:         3.25 cm     LV e' medial:    7.29 cm/s LV PW:         1.80 cm     LV E/e' medial:  13.2 LV IVS:        1.70 cm     LV e' lateral:   10.10 cm/s LVOT diam:     2.30 cm     LV E/e' lateral: 9.5 LV SV:         98 LV SV Index:   43          2D Longitudinal Strain LVOT Area:     4.15 cm    2D Strain GLS Avg:     -17.3 %  LV Volumes (MOD) LV vol d, MOD A2C: 85.1 ml LV vol d, MOD A4C: 97.1 ml LV vol s, MOD A2C: 41.1 ml LV vol s, MOD A4C: 48.7 ml LV SV MOD A2C:     44.0 ml LV SV MOD A4C:     97.1 ml LV SV MOD BP:      47.8 ml RIGHT VENTRICLE             IVC RV S prime:     12.50 cm/s  IVC diam: 2.20 cm TAPSE (M-mode): 1.6 cm LEFT ATRIUM             Index        RIGHT ATRIUM           Index LA diam:        4.50 cm 1.97 cm/m   RA Area:     15.20 cm LA Vol (A2C):   61.4 ml 26.83 ml/m  RA Volume:   34.60 ml  15.12 ml/m LA Vol (A4C):   57.9 ml 25.30 ml/m LA Biplane Vol: 61.7 ml 26.96 ml/m  AORTIC VALVE AV Area (Vmax):    3.99 cm AV Area (Vmean):   3.75 cm AV Area (VTI):     3.74 cm AV Vmax:           147.00 cm/s AV Vmean:          101.000 cm/s AV VTI:            0.262 m AV Peak Grad:      8.6 mmHg AV Mean Grad:      5.0 mmHg LVOT Vmax:         141.00 cm/s LVOT Vmean:        91.200 cm/s LVOT VTI:          0.236 m LVOT/AV VTI ratio: 0.90  AORTA Ao Root diam: 3.20 cm Ao Asc diam:  2.90 cm MITRAL VALVE                TRICUSPID VALVE MV Area (PHT): 4.07 cm    TR Peak grad:   22.7 mmHg MV Decel Time: 187 msec    TR Vmax:        238.00 cm/s MV E velocity: 96.10 cm/s                            SHUNTS  Systemic VTI:  0.24 m                            Systemic Diam: 2.30 cm Sunit Tolia Electronically signed by Tessa Lerner Signature Date/Time: 03/15/2024/5:43:54 PM    Final    VAS [place] LOWER EXTREMITY VENOUS (DVT) Result Date: 03/15/2024  Lower Venous DVT Study Patient Name:  Erik Chung  Date of Exam:   03/15/2024 Medical Rec #: [id number]            Accession #:    [id number] Date of Birth: June 19, 1971           Patient Gender: M Patient Age:   69 years Exam Location:  [facility] Procedure:      VAS [place] LOWER EXTREMITY VENOUS (DVT) Referring Phys: Yanil Dawe POWELL JR --------------------------------------------------------------------------------  Indications: Edema.  Risk Factors: None identified. Limitations: Poor ultrasound/tissue interface. Comparison Study: No prior studies. Performing Technologist: Chanda Busing RVT  Examination Guidelines: Ayeden Gladman complete evaluation includes B-mode imaging, spectral Doppler, color Doppler, and power Doppler as needed of all accessible portions of each vessel. Bilateral testing is considered an integral part of Fabian Coca complete examination. Limited examinations for reoccurring indications may be performed as noted. The reflux portion of the exam is performed with the patient in reverse Trendelenburg.  +---------+---------------+---------+-----------+----------+--------------+ RIGHT    CompressibilityPhasicitySpontaneityPropertiesThrombus Aging +---------+---------------+---------+-----------+----------+--------------+ CFV      Full           Yes      No                                  +---------+---------------+---------+-----------+----------+--------------+ SFJ      Full                                                         +---------+---------------+---------+-----------+----------+--------------+ FV Prox  Full                                                        +---------+---------------+---------+-----------+----------+--------------+ FV Mid   Full                                                        +---------+---------------+---------+-----------+----------+--------------+ FV DistalFull                                                        +---------+---------------+---------+-----------+----------+--------------+ PFV      Full                                                        +---------+---------------+---------+-----------+----------+--------------+  POP      Full           Yes      No                                  +---------+---------------+---------+-----------+----------+--------------+ PTV      Full                                                        +---------+---------------+---------+-----------+----------+--------------+ PERO     Full                                                        +---------+---------------+---------+-----------+----------+--------------+   +---------+---------------+---------+-----------+----------+--------------+ LEFT     CompressibilityPhasicitySpontaneityPropertiesThrombus Aging +---------+---------------+---------+-----------+----------+--------------+ CFV      Full           Yes      No                                  +---------+---------------+---------+-----------+----------+--------------+ SFJ      Full                                                        +---------+---------------+---------+-----------+----------+--------------+ FV Prox  Full                                                        +---------+---------------+---------+-----------+----------+--------------+ FV Mid   Full                                                         +---------+---------------+---------+-----------+----------+--------------+ FV DistalFull                                                        +---------+---------------+---------+-----------+----------+--------------+ PFV      Full                                                        +---------+---------------+---------+-----------+----------+--------------+ POP      Full           Yes      No                                  +---------+---------------+---------+-----------+----------+--------------+  PTV      Full                                                        +---------+---------------+---------+-----------+----------+--------------+ PERO     Full                                                        +---------+---------------+---------+-----------+----------+--------------+    Summary: RIGHT: - There is no evidence of deep vein thrombosis in the lower extremity. However, portions of this examination were limited- see technologist comments above.  - No cystic structure found in the popliteal fossa.  LEFT: - There is no evidence of deep vein thrombosis in the lower extremity. However, portions of this examination were limited- see technologist comments above.  - No cystic structure found in the popliteal fossa.  *See table(s) above for measurements and observations.    Preliminary    DG Chest Port 1 View Result Date: 03/14/2024 CLINICAL DATA:  Shortness of breath. EXAM: PORTABLE CHEST 1 VIEW COMPARISON:  November 18, 2023. FINDINGS: The heart size and mediastinal contours are within normal limits. Both lungs are clear. The visualized skeletal structures are unremarkable. IMPRESSION: No active disease. Electronically Signed   By: Lupita Raider M.D.   On: 03/14/2024 17:42        Scheduled Meds:  dapagliflozin propanediol  10 mg Oral Daily   enoxaparin (LOVENOX) injection  40 mg Subcutaneous Q24H   furosemide  40 mg Intravenous BID   hydrALAZINE  50 mg  Oral Q8H   insulin aspart  0-15 Units Subcutaneous TID WC   insulin aspart  0-5 Units Subcutaneous QHS   insulin glargine  30 Units Subcutaneous QHS   losartan  25 mg Oral Daily   Continuous Infusions:   LOS: 0 days    Time spent: over 30 min    Lacretia Nicks, MD [facility]   To contact the attending provider between 7A-7P or the covering provider during after hours 7P-7A, please log into the web site [email or web address] and access using universal [place] password for that web site. If you do not have the password, please call the hospital operator.  03/15/2024, 7:04 PM

## 2024-03-15 NOTE — Inpatient Diabetes Management (Signed)
 Inpatient Diabetes Program Recommendations  AACE/ADA: New Consensus Statement on Inpatient Glycemic Control (2015)  Target Ranges:  Prepandial:   less than 140 mg/dL      Peak postprandial:   less than 180 mg/dL (1-2 hours)      Critically ill patients:  140 - 180 mg/dL   Lab Results  Component Value Date   GLUCAP 168 (H) 03/15/2024   HGBA1C 12.8 (H) 03/14/2024    Review of Glycemic Control  Diabetes history: DM2 Outpatient Diabetes medications: Basaglar 20 units daily, Ozempic 0.25 mg weekly, metformin 500 daily with breakfast Current orders for Inpatient glycemic control: Lantus 30 at bedtime, Novolog 0-15 TID with meals and 0-5 HS  Last PCP appt on 02/14/2023 HgbA1C - 12.8% (03/14/2024)  Inpatient Diabetes Program Recommendations:    See diabetes discharge order set  Spoke with pt at bedside regarding his diabetes and home regimen for diabetes control. Pt reports he lost his insurance and has not seen PCP in awhile. Has not taken insulin in over 2 months. Discussed glucose and A1C goals, and 12.8% HgbA1C. Discussed importance of checking CBGs and maintaining good CBG control to prevent long-term and short-term complications. Explained how hyperglycemia leads to damage within blood vessels which lead to the common complications seen with uncontrolled diabetes. Stressed to the patient the importance of improving glycemic control to prevent further complications from uncontrolled diabetes. Discussed impact of nutrition, exercise, stress, sickness, and medications on diabetes control.  Discussed carbohydrates, along with portion sizes. Pt states when he was on Ozempic, he had started to lose weight. When he went to refill prescription, it was $1000 and he could not get it filled. Pt also states metformin made him sick on his stomach and he told PCP he wasn't taking it.  Pt seems motivated to get back on track and needs assistance with obtaining PCP and will need affordable insulin.  70/30  at [facility] is good option - 5 pens for $44. Pt states this will work for him.   Answered all questions/concerns.  Thank you. Ailene Ards, RD, LDN, CDCES Inpatient Diabetes Coordinator [phone]

## 2024-03-15 NOTE — Telephone Encounter (Signed)
 Called pt to advise to check back with [place] when he is discharged from the hospital to see if we have any earlier appts. Added patient to waitlist for an earlier appt if one comes available.    Copied from CRM [id number]. Topic: General - Other >> Mar 15, 2024  4:04 PM Eunice Blase wrote: Reason for CRM: Pt has 05/02/2024 appt but need a sooner appt for hospital follow up. Please call pt at [phone]

## 2024-03-15 NOTE — Progress Notes (Signed)
  Echocardiogram 2D Echocardiogram has been performed.  Janalyn Harder 03/15/2024, 10:43 AM

## 2024-03-15 NOTE — Plan of Care (Addendum)
 Patient's BP was 170-180s/90s start of shift, given PRN Labetalol with improvement. Patient c/o 2/10 headache, given PRN Tylenol. All other VSS. BG 296 at bedtime. LBM 3/17. Patient ambulating in room independently throughout shift. No acute events overnight.  Problem: Fluid Volume: Goal: Ability to maintain a balanced intake and output will improve Outcome: Progressing   Problem: Metabolic: Goal: Ability to maintain appropriate glucose levels will improve Outcome: Progressing   Problem: Nutritional: Goal: Maintenance of adequate nutrition will improve Outcome: Progressing   Problem: Education: Goal: Knowledge of General Education information will improve Description: Including pain rating scale, medication(s)/side effects and non-pharmacologic comfort measures Outcome: Progressing   Problem: Clinical Measurements: Goal: Ability to maintain clinical measurements within normal limits will improve Outcome: Progressing Goal: Respiratory complications will improve Outcome: Progressing Goal: Cardiovascular complication will be avoided Outcome: Progressing   Problem: Pain Managment: Goal: General experience of comfort will improve and/or be controlled Outcome: Progressing   Problem: Safety: Goal: Ability to remain free from injury will improve Outcome: Progressing   Problem: Education: Goal: Ability to demonstrate management of disease process will improve Outcome: Progressing   Problem: Cardiac: Goal: Ability to achieve and maintain adequate cardiopulmonary perfusion will improve Outcome: Progressing   Problem: Cardiac: Goal: Ability to achieve and maintain adequate cardiopulmonary perfusion will improve Outcome: Progressing

## 2024-03-16 ENCOUNTER — Other Ambulatory Visit (HOSPITAL_COMMUNITY): Payer: Self-pay

## 2024-03-16 ENCOUNTER — Encounter (HOSPITAL_COMMUNITY): Payer: Self-pay | Admitting: Family Medicine

## 2024-03-16 DIAGNOSIS — M7989 Other specified soft tissue disorders: Secondary | ICD-10-CM

## 2024-03-16 DIAGNOSIS — I5033 Acute on chronic diastolic (congestive) heart failure: Secondary | ICD-10-CM | POA: Diagnosis not present

## 2024-03-16 LAB — CBC WITH DIFFERENTIAL/PLATELET
Abs Immature Granulocytes: 0.06 10*3/uL (ref 0.00–0.07)
Basophils Absolute: 0 10*3/uL (ref 0.0–0.1)
Basophils Relative: 0 %
Eosinophils Absolute: 0.2 10*3/uL (ref 0.0–0.5)
Eosinophils Relative: 2 %
HCT: 38.7 % — ABNORMAL LOW (ref 39.0–52.0)
Hemoglobin: 11.3 g/dL — ABNORMAL LOW (ref 13.0–17.0)
Immature Granulocytes: 1 %
Lymphocytes Relative: 26 %
Lymphs Abs: 2.2 10*3/uL (ref 0.7–4.0)
MCH: 24 pg — ABNORMAL LOW (ref 26.0–34.0)
MCHC: 29.2 g/dL — ABNORMAL LOW (ref 30.0–36.0)
MCV: 82.3 fL (ref 80.0–100.0)
Monocytes Absolute: 0.7 10*3/uL (ref 0.1–1.0)
Monocytes Relative: 8 %
Neutro Abs: 5.2 10*3/uL (ref 1.7–7.7)
Neutrophils Relative %: 63 %
Platelets: 279 10*3/uL (ref 150–400)
RBC: 4.7 MIL/uL (ref 4.22–5.81)
RDW: 15.9 % — ABNORMAL HIGH (ref 11.5–15.5)
WBC: 8.3 10*3/uL (ref 4.0–10.5)
nRBC: 0 % (ref 0.0–0.2)

## 2024-03-16 LAB — GLUCOSE, CAPILLARY
Glucose-Capillary: 147 mg/dL — ABNORMAL HIGH (ref 70–99)
Glucose-Capillary: 153 mg/dL — ABNORMAL HIGH (ref 70–99)

## 2024-03-16 LAB — COMPREHENSIVE METABOLIC PANEL
ALT: 14 U/L (ref 0–44)
AST: 11 U/L — ABNORMAL LOW (ref 15–41)
Albumin: 2.2 g/dL — ABNORMAL LOW (ref 3.5–5.0)
Alkaline Phosphatase: 82 U/L (ref 38–126)
Anion gap: 8 (ref 5–15)
BUN: 23 mg/dL — ABNORMAL HIGH (ref 6–20)
CO2: 30 mmol/L (ref 22–32)
Calcium: 8.9 mg/dL (ref 8.9–10.3)
Chloride: 101 mmol/L (ref 98–111)
Creatinine, Ser: 1.22 mg/dL (ref 0.61–1.24)
GFR, Estimated: >60 mL/min (ref >60)
Glucose, Bld: 133 mg/dL — ABNORMAL HIGH (ref 70–99)
Potassium: 3.3 mmol/L — ABNORMAL LOW (ref 3.5–5.1)
Sodium: 139 mmol/L (ref 135–145)
Total Bilirubin: 0.4 mg/dL (ref 0.0–1.2)
Total Protein: 5.7 g/dL — ABNORMAL LOW (ref 6.5–8.1)

## 2024-03-16 LAB — PHOSPHORUS: Phosphorus: 5.4 mg/dL — ABNORMAL HIGH (ref 2.5–4.6)

## 2024-03-16 LAB — MAGNESIUM: Magnesium: 1.9 mg/dL (ref 1.7–2.4)

## 2024-03-16 MED ORDER — LOSARTAN POTASSIUM 25 MG PO TABS
25.0000 mg | ORAL_TABLET | Freq: Every day | ORAL | 3 refills | Status: AC
  Filled 2024-03-16: qty 30, 30d supply, fill #0

## 2024-03-16 MED ORDER — INSULIN PEN NEEDLE 32G X 4 MM MISC
3 refills | Status: AC
  Filled 2024-03-16: qty 100, 30d supply, fill #0

## 2024-03-16 MED ORDER — HYDROCERIN EX CREA: 1.0000 | TOPICAL_CREAM | Freq: Two times a day (BID) | CUTANEOUS | Status: AC

## 2024-03-16 MED ORDER — HYDRALAZINE HCL 50 MG PO TABS
50.0000 mg | ORAL_TABLET | Freq: Three times a day (TID) | ORAL | 2 refills | Status: AC
  Filled 2024-03-16: qty 90, 30d supply, fill #0

## 2024-03-16 MED ORDER — FUROSEMIDE 20 MG PO TABS
20.0000 mg | ORAL_TABLET | Freq: Every day | ORAL | 0 refills | Status: AC | PRN
  Filled 2024-03-16: qty 30, 30d supply, fill #0

## 2024-03-16 MED ORDER — DAPAGLIFLOZIN PROPANEDIOL 10 MG PO TABS
10.0000 mg | ORAL_TABLET | Freq: Every day | ORAL | 2 refills | Status: AC
  Filled 2024-03-16: qty 30, 30d supply, fill #0

## 2024-03-16 MED ORDER — HYDROCERIN EX CREA
TOPICAL_CREAM | Freq: Two times a day (BID) | CUTANEOUS | Status: DC
  Administered 2024-03-16: 1 via TOPICAL
  Filled 2024-03-16: qty 113

## 2024-03-16 MED ORDER — POTASSIUM CHLORIDE CRYS ER 20 MEQ PO TBCR
40.0000 meq | EXTENDED_RELEASE_TABLET | Freq: Once | ORAL | Status: AC
  Administered 2024-03-16: 40 meq via ORAL
  Filled 2024-03-16: qty 2

## 2024-03-16 MED ORDER — INSULIN ASPART PROT & ASPART (70-30 MIX) 100 UNIT/ML PEN
15.0000 [IU] | PEN_INJECTOR | Freq: Two times a day (BID) | SUBCUTANEOUS | 3 refills | Status: AC
Start: 2024-03-16 — End: ?
  Filled 2024-03-16: qty 9, 30d supply, fill #0

## 2024-03-16 NOTE — Progress Notes (Signed)
 AVS reviewed w/ patient who  verbalized an understanding. No other questions at this time. PIV  removed as noted. Pt had removed tele prior to this RN reviewing AVS. Pt dressed for d/c to home.

## 2024-03-16 NOTE — TOC Progression Note (Signed)
 Transition of Care [facility]) - Progression Note    Patient Details  Name: Erik Chung MRN: [id number] Date of Birth: [birth year]  Transition of Care [facility]) CM/SW Contact  Otelia Santee, LCSW Phone Number: 03/16/2024, 12:30 PM  Clinical Narrative:    Resources for food and walk-in clinic provided to pt. Following for medication assistance through [facility] at discharge.         Expected Discharge Plan and Services                                               Social Determinants of Health (SDOH) Interventions SDOH Screenings   Food Insecurity: Food Insecurity Present (03/14/2024)  Housing: High Risk (03/14/2024)  Transportation Needs: No Transportation Needs (03/14/2024)  Utilities: Not At Risk (03/14/2024)  Financial Resource Strain: Low Risk  (02/14/2023)   Received from [facility], [facility]  Social Connections: Unknown (02/09/2023)   Received from [facility], [facility]  Tobacco Use: High Risk (03/15/2024)    Readmission Risk Interventions     No data to display

## 2024-03-16 NOTE — TOC Transition Note (Signed)
 Transition of Care [facility]) - Discharge Note   Patient Details  Name: Erik Chung MRN: [id number] Date of Birth: Nov 27, 1971  Transition of Care [facility]) CM/SW Contact:  Otelia Santee, LCSW Phone Number: 03/16/2024, 3:36 PM   Clinical Narrative:    MATCH provided to patient. [facility] pharmacy information provided to pt to utilize for ongoing prescriptions.    Final next level of care: Home/Self Care Barriers to Discharge: No Barriers Identified   Patient Goals and CMS Choice            Discharge Placement                       Discharge Plan and Services Additional resources added to the After Visit Summary for                                       Social Drivers of Health (SDOH) Interventions SDOH Screenings   Food Insecurity: Food Insecurity Present (03/14/2024)  Housing: High Risk (03/14/2024)  Transportation Needs: No Transportation Needs (03/14/2024)  Utilities: Not At Risk (03/14/2024)  Financial Resource Strain: Low Risk  (02/14/2023)   Received from [facility], [facility]  Social Connections: Unknown (02/09/2023)   Received from [facility], [facility]  Tobacco Use: High Risk (03/15/2024)     Readmission Risk Interventions     No data to display

## 2024-03-16 NOTE — Plan of Care (Addendum)
 Patient's BP was 170-180s/90s start of shift, given PRN Labetalol with improvement. Patient c/o headache, given PRN Tylenol. All other VSS. BG 189 at bedtime. Patient ambulating independently in room throughout shift. No acute events overnight.  Problem: Metabolic: Goal: Ability to maintain appropriate glucose levels will improve Outcome: Progressing   Problem: Nutritional: Goal: Maintenance of adequate nutrition will improve Outcome: Progressing   Problem: Education: Goal: Knowledge of General Education information will improve Description: Including pain rating scale, medication(s)/side effects and non-pharmacologic comfort measures Outcome: Progressing   Problem: Clinical Measurements: Goal: Ability to maintain clinical measurements within normal limits will improve Outcome: Progressing Goal: Will remain free from infection Outcome: Progressing Goal: Respiratory complications will improve Outcome: Progressing   Problem: Safety: Goal: Ability to remain free from injury will improve Outcome: Progressing   Problem: Cardiac: Goal: Ability to achieve and maintain adequate cardiopulmonary perfusion will improve Outcome: Progressing   Problem: Cardiac: Goal: Ability to achieve and maintain adequate cardiopulmonary perfusion will improve Outcome: Progressing

## 2024-03-16 NOTE — TOC Progression Note (Signed)
 Transition of Care [facility]) - Progression Note    Patient Details  Name: Erik Chung MRN: [id number] Date of Birth: 09-21-1971  Transition of Care [facility]) CM/SW Contact  Yeslin Delio, Olegario Messier, RN Phone Number: 03/16/2024, 3:22 PM  Clinical Narrative:   MATCH MEDICATION ASSISTANCE CARD Pharmacies please call [phone] for claim processing assistance.  Rx BIN: [id number] Rx Group: [id number] Rx PCN: [id number] Relationship Code: 1 Person Code: [id number]  Patient ID (MRN): [name]    Patient Name: Erik Chung   Patient DOB:1971-09-21   Discharge Date: 03/16/2024  Expiration Date:03/23/2024 (must be filled within 7 days of discharge)    Dear Erik Chung You have been approved to have the prescriptions written by your discharging physician filled through our [facility] (Medication Assistance Through [facility]) program. This program allows for a one-time (no refills) 34-day supply of selected medications for a low copay amount.  The copay is $3.00 per prescription. For instance, if you have one prescription, you will pay $3.00; for two prescriptions, you pay $6.00; for three prescriptions, you pay $9.00; and so on. Only certain pharmacies are participating in this program with [facility]. You will need to select one of the pharmacies from the attached lists and take your prescriptions, this letter, and your photo ID to one of the participating pharmacies.  We are excited that you are able to use the [facility] program to get your medications. These prescriptions must be filled within 7 days of hospital discharge or they will no longer be valid for the [facility] program. Should you have any problems with your prescriptions please contact your case management team member at [phone] for [name] or [phone] for [name].  Thank you, [place]      Expected Discharge Plan: Home/Self Care    Expected Discharge Plan and Services         Expected  Discharge Date: 03/16/24                                     Social Determinants of Health (SDOH) Interventions SDOH Screenings   Food Insecurity: Food Insecurity Present (03/14/2024)  Housing: High Risk (03/14/2024)  Transportation Needs: No Transportation Needs (03/14/2024)  Utilities: Not At Risk (03/14/2024)  Financial Resource Strain: Low Risk  (02/14/2023)   Received from [facility], [facility]  Social Connections: Unknown (02/09/2023)   Received from [facility], [facility]  Tobacco Use: High Risk (03/15/2024)    Readmission Risk Interventions     No data to display

## 2024-03-16 NOTE — Discharge Summary (Signed)
 Physician Discharge Summary   Patient: Erik Chung MRN: [id number] DOB: 20-Nov-1971  Admit date:     03/14/2024  Discharge date: 03/16/24  Discharge Physician: Meredeth Ide   PCP: Patient, No Pcp Per   Recommendations at discharge:   Follow-up PCP as outpatient Will need referral for lymphedema clinic at [facility] [address] [place], [place] [address] [phone]   Discharge Diagnoses: Principal Problem:   CHF exacerbation (HCC) Active Problems:   HTN (hypertension)   Diabetes mellitus (HCC)   Congestive heart failure (HCC)  Resolved Problems:   * No resolved hospital problems. *  Hospital Course:   53 y.o. male with medical history significant of essential hypertension, currently untreated and type 2 diabetes currently untreated complaints of being short of breath and new onset leg swelling for about 2 weeks.    Being treated for HF exacerbation.  Assessment and Plan:  Acute Diastolic Heart Failure Exacerbation -Improved C/o SOB, mostly at night - sounds c/w orthopnea/PND CXR without active disease No ascites on US Echo with EF 55-60%, severe concentric LVH, trivial pericardial effusion - IVC dilated with >50% resp variability LE [place] without DVT -He will need referral for cardiology as outpatient -Started on Farxiga -Take Lasix 20 mg daily as needed for dyspnea/fluid   Type 2 Diabetes  Uncontrolled with Hyperglycemia A1c 12.8 Hasn't been taking any medicines due to cost Will discharge on NovoLog 70/30 15 units subcu twice daily -Also started on Farxiga 10 mg daily -He does have proteinuria; recommend to continue taking Farxiga, losartan -May need referral for nephrology as outpatient  Lymphedema/lipodermatosclerosis -Started on Eucerin cream -Follow-up lymphedema clinic as outpatient -Will need referral for outpatient lymphedema clinic in [facility]    Hypertension Continue hydralazine,  losartan 25 mg daily   Dyslipidemia Follow with PCP as outpatient   Tobacco Abuse Encourage cessation   Hx Cocaine Abuse Encourage cessation - avoid beta blockers   Not taking any meds at home due to cost.  TOC c/s to help.           Consultants:  Procedures performed:  Disposition: Home Diet recommendation:  Discharge Diet Orders (From admission, onward)     Start     Ordered   03/16/24 0000  Diet - low sodium heart healthy        03/16/24 1448           Carb modified diet DISCHARGE MEDICATION: Allergies as of 03/16/2024       Reactions   Shellfish Allergy Anaphylaxis   Penicillins Nausea And Vomiting, Other (See Comments)   GI Intolerance        Medication List     STOP taking these medications    benzonatate 100 MG capsule Commonly known as: TESSALON   cefadroxil 500 MG capsule Commonly known as: DURICEF   doxycycline 100 MG capsule Commonly known as: VIBRAMYCIN   gabapentin 300 MG capsule Commonly known as: Neurontin   gemfibrozil 600 MG tablet Commonly known as: Lopid   HYDROcodone-acetaminophen 5-325 MG tablet Commonly known as: Norco   Insulin Aspart FlexPen 100 UNIT/ML Commonly known as: NOVOLOG   lisinopril 10 MG tablet Commonly known as: ZESTRIL   metFORMIN 500 MG tablet Commonly known as: GLUCOPHAGE   ondansetron 4 MG tablet Commonly known as: ZOFRAN       TAKE these medications    dapagliflozin propanediol 10 MG Tabs tablet Commonly known as: FARXIGA Take 1 tablet (10 mg total) by mouth  daily. Start taking on: March 17, 2024   furosemide 20 MG tablet Commonly known as: LASIX Take 1 tablet (20 mg total) by mouth daily as needed.   hydrALAZINE 50 MG tablet Commonly known as: APRESOLINE Take 1 tablet (50 mg total) by mouth every 8 (eight) hours.   hydrocerin Crea Apply 1 Application topically 2 (two) times daily for 14 days.   insulin aspart protamine - aspart (70-30) 100 UNIT/ML FlexPen Commonly known as:  NOVOLOG 70/30 MIX Inject 15 Units into the skin 2 (two) times daily.   Insulin Pen Needle 32G X 4 MM Misc Use with Insulin pen   losartan 25 MG tablet Commonly known as: COZAAR Take 1 tablet (25 mg total) by mouth daily. Start taking on: March 17, 2024        Follow-up Information     [facility]. Go to.   Why: To start, you'll need to come in for an eligibility screening and get approved before using our services. Screening Times: Eligibility screenings are held every Tuesday and Wednesday from 1:00 PM to 3:00 PM. No appointment is needed for the interview. Contact information: [address], [place], [place] [address]  Phone: [phone] Fax: [phone]        [facility] - A Dept Of [facility]. Go on 05/02/2024.   Specialty: Internal Medicine Why: You have an appointment on Wednesday, 05/02/24 at 8:00am to establish primary care services. Please arrive 15 minutes early to complete new patient paperwork. You have also been placed on a wait-list to be called for an earlier appointment date. Contact information: [address] [place] [address] [phone] Additional information: [address] [place], [place] [address]               Discharge Exam: Ceasar Mons Weights   03/14/24 1514 03/15/24 0500  Weight: 102.1 kg 111.3 kg   General-appears in no acute distress Heart-S1-S2, regular, no murmur auscultated Lungs-clear to auscultation bilaterally, no wheezing or crackles auscultated Abdomen-soft, nontender, no organomegaly Extremities-no edema in the lower extremities Neuro-alert, oriented x3, no focal deficit noted  Condition at discharge: good  The results of significant diagnostics from this hospitalization (including imaging, microbiology, ancillary and laboratory) are listed below for reference.   Imaging Studies: VAS [place] LOWER EXTREMITY VENOUS (DVT) Result Date:  03/16/2024  Lower Venous DVT Study Patient Name:  Erik Chung  Date of Exam:   03/15/2024 Medical Rec #: [id number]            Accession #:    [id number] Date of Birth: [birth year]           Patient Gender: M Patient Age:   76 years Exam Location:  [facility] Procedure:      VAS [place] LOWER EXTREMITY VENOUS (DVT) Referring Phys: A POWELL JR --------------------------------------------------------------------------------  Indications: Edema.  Risk Factors: None identified. Limitations: Poor ultrasound/tissue interface. Comparison Study: No prior studies. Performing Technologist: Chanda Busing RVT  Examination Guidelines: A complete evaluation includes B-mode imaging, spectral Doppler, color Doppler, and power Doppler as needed of all accessible portions of each vessel. Bilateral testing is considered an integral part of a complete examination. Limited examinations for reoccurring indications may be performed as noted. The reflux portion of the exam is performed with the patient in reverse Trendelenburg.  +---------+---------------+---------+-----------+----------+--------------+ RIGHT    CompressibilityPhasicitySpontaneityPropertiesThrombus Aging +---------+---------------+---------+-----------+----------+--------------+ CFV      Full  Yes      No                                  +---------+---------------+---------+-----------+----------+--------------+ SFJ      Full                                                        +---------+---------------+---------+-----------+----------+--------------+ FV Prox  Full                                                        +---------+---------------+---------+-----------+----------+--------------+ FV Mid   Full                                                        +---------+---------------+---------+-----------+----------+--------------+ FV DistalFull                                                         +---------+---------------+---------+-----------+----------+--------------+ PFV      Full                                                        +---------+---------------+---------+-----------+----------+--------------+ POP      Full           Yes      No                                  +---------+---------------+---------+-----------+----------+--------------+ PTV      Full                                                        +---------+---------------+---------+-----------+----------+--------------+ PERO     Full                                                        +---------+---------------+---------+-----------+----------+--------------+   +---------+---------------+---------+-----------+----------+--------------+ LEFT     CompressibilityPhasicitySpontaneityPropertiesThrombus Aging +---------+---------------+---------+-----------+----------+--------------+ CFV      Full           Yes      No                                  +---------+---------------+---------+-----------+----------+--------------+ SFJ  Full                                                        +---------+---------------+---------+-----------+----------+--------------+ FV Prox  Full                                                        +---------+---------------+---------+-----------+----------+--------------+ FV Mid   Full                                                        +---------+---------------+---------+-----------+----------+--------------+ FV DistalFull                                                        +---------+---------------+---------+-----------+----------+--------------+ PFV      Full                                                        +---------+---------------+---------+-----------+----------+--------------+ POP      Full           Yes      No                                   +---------+---------------+---------+-----------+----------+--------------+ PTV      Full                                                        +---------+---------------+---------+-----------+----------+--------------+ PERO     Full                                                        +---------+---------------+---------+-----------+----------+--------------+     Summary: RIGHT: - There is no evidence of deep vein thrombosis in the lower extremity. However, portions of this examination were limited- see technologist comments above.  - No cystic structure found in the popliteal fossa.  LEFT: - There is no evidence of deep vein thrombosis in the lower extremity. However, portions of this examination were limited- see technologist comments above.  - No cystic structure found in the popliteal fossa.  *See table(s) above for measurements and observations. Electronically signed by Lemar Livings MD on 03/16/2024 at 2:22:14 PM.    Final    [place] ASCITES (ABDOMEN LIMITED) Result Date: 03/15/2024 CLINICAL DATA:  Abdominal distension EXAM: LIMITED ABDOMEN  ULTRASOUND FOR ASCITES TECHNIQUE: Limited ultrasound survey for ascites was performed in all four abdominal quadrants. COMPARISON:  None Available. FINDINGS: Assessment of the 4 quadrants demonstrates no evidence of ascites. IMPRESSION: 1. No ascites identified to account for the patient's reported abdominal distension. Electronically Signed   By: Gaylyn Rong M.D.   On: 03/15/2024 18:03   ECHOCARDIOGRAM COMPLETE Result Date: 03/15/2024    ECHOCARDIOGRAM REPORT   Patient Name:   TREVANTE TENNELL Date of Exam: 03/15/2024 Medical Rec #:  [id number]           Height:       70.5 in Accession #:    [id number]          Weight:       245.4 lb Date of Birth:  August 09, 1971          BSA:          2.289 m Patient Age:    52 years            BP:           122/64 mmHg Patient Gender: M                   HR:           91 bpm. Exam Location:  Inpatient Procedure: 2D  Echo, Cardiac Doppler, Color Doppler and Strain Analysis (Both            Spectral and Color Flow Doppler were utilized during procedure). Indications:    I50.40* Unspecified combined systolic (congestive) and diastolic                 (congestive) heart failure  History:        Patient has no prior history of Echocardiogram examinations.                 CHF; Risk Factors:Hypertension and Diabetes.  Sonographer:    Sheralyn Boatman RDCS Referring Phys: [id number] [facility] IMPRESSIONS  1. Left ventricular ejection fraction, by estimation, is 55 to 60%. The left ventricle has normal function. The left ventricle has no regional wall motion abnormalities. There is severe concentric left ventricular hypertrophy. Left ventricular diastolic  parameters were normal. The average left ventricular global longitudinal strain is -17.3 %. The global longitudinal strain is normal.  2. Right ventricular systolic function is normal. The right ventricular size is normal. Mildly increased right ventricular wall thickness. There is normal pulmonary artery systolic pressure. The estimated right ventricular systolic pressure is 30.7 mmHg.  3. Trivial pericardial effusion is present. There is no evidence of cardiac tamponade.  4. The mitral valve is degenerative. Trivial mitral valve regurgitation. No evidence of mitral stenosis.  5. The aortic valve is tricuspid. Aortic valve regurgitation is mild. No aortic stenosis is present.  6. The inferior vena cava is dilated in size with >50% respiratory variability, suggesting right atrial pressure of 8 mmHg. Comparison(s): No prior Echocardiogram. Conclusion(s)/Recommendation(s): Consider cMRI to further evaluate the left ventricular hypertrophy, if clinically indicated. FINDINGS  Left Ventricle: Left ventricular ejection fraction, by estimation, is 55 to 60%. The left ventricle has normal function. The left ventricle has no regional wall motion abnormalities. The average left ventricular global  longitudinal strain is -17.3 %. Strain was performed and the global longitudinal strain is normal. The left ventricular internal cavity size was normal in size. There is severe concentric left ventricular hypertrophy. Left ventricular diastolic parameters were normal. Right Ventricle: The right ventricular size is normal. Mildly  increased right ventricular wall thickness. Right ventricular systolic function is normal. There is normal pulmonary artery systolic pressure. The tricuspid regurgitant velocity is 2.38 m/s, and with an assumed right atrial pressure of 8 mmHg, the estimated right ventricular systolic pressure is 30.7 mmHg. Left Atrium: Left atrial size was normal in size. Right Atrium: Right atrial size was normal in size. Pericardium: Trivial pericardial effusion is present. There is no evidence of cardiac tamponade. Mitral Valve: The mitral valve is degenerative in appearance. Trivial mitral valve regurgitation. No evidence of mitral valve stenosis. Tricuspid Valve: The tricuspid valve is normal in structure. Tricuspid valve regurgitation is mild . No evidence of tricuspid stenosis. Aortic Valve: The aortic valve is tricuspid. Aortic valve regurgitation is mild. No aortic stenosis is present. Aortic valve mean gradient measures 5.0 mmHg. Aortic valve peak gradient measures 8.6 mmHg. Aortic valve area, by VTI measures 3.74 cm. Pulmonic Valve: The pulmonic valve was normal in structure. Pulmonic valve regurgitation is not visualized. No evidence of pulmonic stenosis. Aorta: The aortic root and ascending aorta are structurally normal, with no evidence of dilitation. Venous: The inferior vena cava is dilated in size with greater than 50% respiratory variability, suggesting right atrial pressure of 8 mmHg. IAS/Shunts: The interatrial septum was not well visualized.  LEFT VENTRICLE PLAX 2D LVIDd:         4.70 cm     Diastology LVIDs:         3.25 cm     LV e' medial:    7.29 cm/s LV PW:         1.80 cm     LV  E/e' medial:  13.2 LV IVS:        1.70 cm     LV e' lateral:   10.10 cm/s LVOT diam:     2.30 cm     LV E/e' lateral: 9.5 LV SV:         98 LV SV Index:   43          2D Longitudinal Strain LVOT Area:     4.15 cm    2D Strain GLS Avg:     -17.3 %  LV Volumes (MOD) LV vol d, MOD A2C: 85.1 ml LV vol d, MOD A4C: 97.1 ml LV vol s, MOD A2C: 41.1 ml LV vol s, MOD A4C: 48.7 ml LV SV MOD A2C:     44.0 ml LV SV MOD A4C:     97.1 ml LV SV MOD BP:      47.8 ml RIGHT VENTRICLE             IVC RV S prime:     12.50 cm/s  IVC diam: 2.20 cm TAPSE (M-mode): 1.6 cm LEFT ATRIUM             Index        RIGHT ATRIUM           Index LA diam:        4.50 cm 1.97 cm/m   RA Area:     15.20 cm LA Vol (A2C):   61.4 ml 26.83 ml/m  RA Volume:   34.60 ml  15.12 ml/m LA Vol (A4C):   57.9 ml 25.30 ml/m LA Biplane Vol: 61.7 ml 26.96 ml/m  AORTIC VALVE AV Area (Vmax):    3.99 cm AV Area (Vmean):   3.75 cm AV Area (VTI):     3.74 cm AV Vmax:  147.00 cm/s AV Vmean:          101.000 cm/s AV VTI:            0.262 m AV Peak Grad:      8.6 mmHg AV Mean Grad:      5.0 mmHg LVOT Vmax:         141.00 cm/s LVOT Vmean:        91.200 cm/s LVOT VTI:          0.236 m LVOT/AV VTI ratio: 0.90  AORTA Ao Root diam: 3.20 cm Ao Asc diam:  2.90 cm MITRAL VALVE               TRICUSPID VALVE MV Area (PHT): 4.07 cm    TR Peak grad:   22.7 mmHg MV Decel Time: 187 msec    TR Vmax:        238.00 cm/s MV E velocity: 96.10 cm/s                            SHUNTS                            Systemic VTI:  0.24 m                            Systemic Diam: 2.30 cm Sunit Tolia Electronically signed by Tessa Lerner Signature Date/Time: 03/15/2024/5:43:54 PM    Final    DG Chest Port 1 View Result Date: 03/14/2024 CLINICAL DATA:  Shortness of breath. EXAM: PORTABLE CHEST 1 VIEW COMPARISON:  November 18, 2023. FINDINGS: The heart size and mediastinal contours are within normal limits. Both lungs are clear. The visualized skeletal structures are unremarkable.  IMPRESSION: No active disease. Electronically Signed   By: Lupita Raider M.D.   On: 03/14/2024 17:42    Microbiology: Results for orders placed or performed during the hospital encounter of 11/18/23  Resp panel by RT-PCR (RSV, Flu A&B, Covid) Anterior Nasal Swab     Status: None   Collection Time: 11/18/23  3:04 PM   Specimen: Anterior Nasal Swab  Result Value Ref Range Status   SARS Coronavirus 2 by RT PCR NEGATIVE NEGATIVE Final    Comment: (NOTE) SARS-CoV-2 target nucleic acids are NOT DETECTED.  The SARS-CoV-2 RNA is generally detectable in upper respiratory specimens during the acute phase of infection. The lowest concentration of SARS-CoV-2 viral copies this assay can detect is 138 copies/mL. A negative result does not preclude SARS-Cov-2 infection and should not be used as the sole basis for treatment or other patient management decisions. A negative result may occur with  improper specimen collection/handling, submission of specimen other than nasopharyngeal swab, presence of viral mutation(s) within the areas targeted by this assay, and inadequate number of viral copies(<138 copies/mL). A negative result must be combined with clinical observations, patient history, and epidemiological information. The expected result is Negative.  Fact Sheet for Patients:  BloggerCourse.com  Fact Sheet for Healthcare Providers:  SeriousBroker.it  This test is no t yet approved or cleared by the [place] FDA and  has been authorized for detection and/or diagnosis of SARS-CoV-2 by FDA under an Emergency Use Authorization (EUA). This EUA will remain  in effect (meaning this test can be used) for the duration of the COVID-19 declaration under Section 564(b)(1) of the Act, 21 U.S.C.section 360bbb-3(b)(1), unless the  authorization is terminated  or revoked sooner.       Influenza A by PCR NEGATIVE NEGATIVE Final   Influenza B by PCR  NEGATIVE NEGATIVE Final    Comment: (NOTE) The Xpert Xpress SARS-CoV-2/FLU/RSV plus assay is intended as an aid in the diagnosis of influenza from Nasopharyngeal swab specimens and should not be used as a sole basis for treatment. Nasal washings and aspirates are unacceptable for Xpert Xpress SARS-CoV-2/FLU/RSV testing.  Fact Sheet for Patients: BloggerCourse.com  Fact Sheet for Healthcare Providers: SeriousBroker.it  This test is not yet approved or cleared by the [place] FDA and has been authorized for detection and/or diagnosis of SARS-CoV-2 by FDA under an Emergency Use Authorization (EUA). This EUA will remain in effect (meaning this test can be used) for the duration of the COVID-19 declaration under Section 564(b)(1) of the Act, 21 U.S.C. section 360bbb-3(b)(1), unless the authorization is terminated or revoked.     Resp Syncytial Virus by PCR NEGATIVE NEGATIVE Final    Comment: (NOTE) Fact Sheet for Patients: BloggerCourse.com  Fact Sheet for Healthcare Providers: SeriousBroker.it  This test is not yet approved or cleared by the [place] FDA and has been authorized for detection and/or diagnosis of SARS-CoV-2 by FDA under an Emergency Use Authorization (EUA). This EUA will remain in effect (meaning this test can be used) for the duration of the COVID-19 declaration under Section 564(b)(1) of the Act, 21 U.S.C. section 360bbb-3(b)(1), unless the authorization is terminated or revoked.  Performed at [facility], [address] [place], [place] [address]     Labs: CBC: Recent Labs  Lab 03/14/24 1519 03/15/24 0837 03/16/24 0519  WBC 9.1 8.5 8.3  NEUTROABS 6.6  --  5.2  HGB 11.2* 11.1* 11.3*  HCT 37.1* 39.1 38.7*  MCV 80.7 83.0 82.3  PLT 277 268 279   Basic Metabolic Panel: Recent Labs  Lab 03/14/24 1519 03/15/24 0837 03/16/24 0519   NA 133* 136 139  K 3.7 3.8 3.3*  CL 99 101 101  CO2 27 27 30   GLUCOSE 340* 221* 133*  BUN 20 21* 23*  CREATININE 1.09 1.17 1.22  CALCIUM 8.0* 8.2* 8.9  MG  --   --  1.9  PHOS  --   --  5.4*   Liver Function Tests: Recent Labs  Lab 03/14/24 1519 03/15/24 0837 03/16/24 0519  AST 19 11* 11*  ALT 18 16 14   ALKPHOS 97 78 82  BILITOT 0.4 0.4 0.4  PROT 6.2* 5.8* 5.7*  ALBUMIN 2.4* 2.3* 2.2*   CBG: Recent Labs  Lab 03/15/24 1205 03/15/24 1625 03/15/24 2055 03/16/24 0725 03/16/24 1135  GLUCAP 168* 172* 189* 147* 153*    Discharge time spent: greater than 30 minutes.  Signed: Meredeth Ide, MD [facility] 03/16/2024

## 2024-03-16 NOTE — Consult Note (Signed)
 Lymphedema  Resources (updated 10/2021) Each site requires a referral from your primary care MD [facility] [address] [place], [place]  [phone] (Upper extremities)  [address] [place], [place] [phone] (Lower extremities, PATIENT CAN NOT HAVE A WOUND)  [facility] [address] [place], [place] [address] [phone]  [facility] [address]  [place], [place] [phone]  [facility] [address] [place], [place] [address] [phone]  [facility]  (only treatment for lymphedema related to cancer diagnosis) [address]  [place], [place] [address] [phone]    [facility] [address] [place], [place] [address] [phone] [facility] ([facility]) [phone] [address] [place], [place] [address] [phone]

## 2024-04-17 ENCOUNTER — Other Ambulatory Visit (HOSPITAL_BASED_OUTPATIENT_CLINIC_OR_DEPARTMENT_OTHER): Payer: Self-pay

## 2024-04-17 ENCOUNTER — Encounter (HOSPITAL_BASED_OUTPATIENT_CLINIC_OR_DEPARTMENT_OTHER): Payer: Self-pay | Admitting: Emergency Medicine

## 2024-04-17 ENCOUNTER — Other Ambulatory Visit: Payer: Self-pay

## 2024-04-17 ENCOUNTER — Emergency Department (HOSPITAL_BASED_OUTPATIENT_CLINIC_OR_DEPARTMENT_OTHER): Payer: Self-pay

## 2024-04-17 ENCOUNTER — Emergency Department (HOSPITAL_BASED_OUTPATIENT_CLINIC_OR_DEPARTMENT_OTHER)
Admission: EM | Admit: 2024-04-17 | Discharge: 2024-04-17 | Disposition: A | Discharge status: Home / Self Care | Payer: Self-pay | Attending: Emergency Medicine | Admitting: Emergency Medicine

## 2024-04-17 DIAGNOSIS — E119 Type 2 diabetes mellitus without complications: Secondary | ICD-10-CM | POA: Insufficient documentation

## 2024-04-17 DIAGNOSIS — L03213 Periorbital cellulitis: Secondary | ICD-10-CM | POA: Diagnosis not present

## 2024-04-17 DIAGNOSIS — I509 Heart failure, unspecified: Secondary | ICD-10-CM | POA: Insufficient documentation

## 2024-04-17 DIAGNOSIS — S0501XA Injury of conjunctiva and corneal abrasion without foreign body, right eye, initial encounter: Secondary | ICD-10-CM | POA: Diagnosis present

## 2024-04-17 DIAGNOSIS — I110 Hypertensive heart disease with heart failure: Secondary | ICD-10-CM | POA: Diagnosis not present

## 2024-04-17 DIAGNOSIS — Z794 Long term (current) use of insulin: Secondary | ICD-10-CM | POA: Diagnosis not present

## 2024-04-17 DIAGNOSIS — X58XXXA Exposure to other specified factors, initial encounter: Secondary | ICD-10-CM | POA: Insufficient documentation

## 2024-04-17 LAB — BASIC METABOLIC PANEL WITH GFR
Anion gap: 11 (ref 5–15)
BUN: 15 mg/dL (ref 6–20)
CO2: 28 mmol/L (ref 22–32)
Calcium: 9.5 mg/dL (ref 8.9–10.3)
Chloride: 101 mmol/L (ref 98–111)
Creatinine, Ser: 1.08 mg/dL (ref 0.61–1.24)
GFR, Estimated: >60 mL/min (ref >60)
Glucose, Bld: 272 mg/dL — ABNORMAL HIGH (ref 70–99)
Potassium: 3.9 mmol/L (ref 3.5–5.1)
Sodium: 139 mmol/L (ref 135–145)

## 2024-04-17 LAB — CBC WITH DIFFERENTIAL/PLATELET
Abs Immature Granulocytes: 0.03 10*3/uL (ref 0.00–0.07)
Basophils Absolute: 0 10*3/uL (ref 0.0–0.1)
Basophils Relative: 0 %
Eosinophils Absolute: 0.1 10*3/uL (ref 0.0–0.5)
Eosinophils Relative: 1 %
HCT: 43.7 % (ref 39.0–52.0)
Hemoglobin: 13.3 g/dL (ref 13.0–17.0)
Immature Granulocytes: 0 %
Lymphocytes Relative: 17 %
Lymphs Abs: 1.5 10*3/uL (ref 0.7–4.0)
MCH: 24 pg — ABNORMAL LOW (ref 26.0–34.0)
MCHC: 30.4 g/dL (ref 30.0–36.0)
MCV: 78.7 fL — ABNORMAL LOW (ref 80.0–100.0)
Monocytes Absolute: 0.7 10*3/uL (ref 0.1–1.0)
Monocytes Relative: 8 %
Neutro Abs: 6.5 10*3/uL (ref 1.7–7.7)
Neutrophils Relative %: 74 %
Platelets: 237 10*3/uL (ref 150–400)
RBC: 5.55 MIL/uL (ref 4.22–5.81)
RDW: 16.4 % — ABNORMAL HIGH (ref 11.5–15.5)
WBC: 8.8 10*3/uL (ref 4.0–10.5)
nRBC: 0 % (ref 0.0–0.2)

## 2024-04-17 MED ORDER — CEFDINIR 300 MG PO CAPS
300.0000 mg | ORAL_CAPSULE | Freq: Two times a day (BID) | ORAL | 0 refills | Status: AC
  Filled 2024-04-17: qty 14, 7d supply, fill #0

## 2024-04-17 MED ORDER — NEOMYCIN-POLYMYXIN-DEXAMETH 3.5-10000-0.1 OP OINT
TOPICAL_OINTMENT | OPHTHALMIC | 0 refills | Status: AC
  Filled 2024-04-17: qty 3.5, 10d supply, fill #0

## 2024-04-17 MED ORDER — CIPROFLOXACIN HCL 0.3 % OP OINT
TOPICAL_OINTMENT | OPHTHALMIC | 0 refills | Status: DC
  Filled 2024-04-17: qty 3.5, fill #0

## 2024-04-17 MED ORDER — TETRACAINE HCL 0.5 % OP SOLN
1.0000 [drp] | Freq: Once | OPHTHALMIC | Status: AC
  Administered 2024-04-17: 1 [drp] via OPHTHALMIC
  Filled 2024-04-17: qty 4

## 2024-04-17 MED ORDER — FLUORESCEIN SODIUM 1 MG OP STRP
1.0000 | ORAL_STRIP | Freq: Once | OPHTHALMIC | Status: AC
  Administered 2024-04-17: 1 via OPHTHALMIC
  Filled 2024-04-17: qty 1

## 2024-04-17 MED ORDER — CEFPODOXIME PROXETIL 200 MG PO TABS
400.0000 mg | ORAL_TABLET | Freq: Two times a day (BID) | ORAL | 0 refills | Status: DC
  Filled 2024-04-17: qty 28, 7d supply, fill #0

## 2024-04-17 MED ORDER — CLINDAMYCIN HCL 150 MG PO CAPS
300.0000 mg | ORAL_CAPSULE | Freq: Three times a day (TID) | ORAL | 0 refills | Status: AC
  Filled 2024-04-17: qty 42, 7d supply, fill #0

## 2024-04-17 MED ORDER — IOHEXOL 300 MG/ML  SOLN
100.0000 mL | Freq: Once | INTRAMUSCULAR | Status: AC | PRN
  Administered 2024-04-17: 75 mL via INTRAVENOUS

## 2024-04-17 NOTE — ED Triage Notes (Signed)
 Rt eye draining and red for a couple of weeks

## 2024-04-17 NOTE — Discharge Instructions (Addendum)
 Today you were seen for a corneal abrasion and periorbital cellulitis of your right eye.  Please pick up your medications and take as prescribed.  Please return to the ED if your symptoms worsen, you have uncontrolled fever, or vomiting.  Thank you for letting us  treat you today. After reviewing your labs and imaging, I feel you are safe to go home. Please follow up with your PCP in the next several days and provide them with your records from this visit. Return to the Emergency Room if pain becomes severe or symptoms worsen.

## 2024-04-17 NOTE — ED Provider Notes (Signed)
 [place] [facility] Provider Note   CSN: [id number] Arrival date & time: 04/17/24  1119     History  Chief Complaint  Patient presents with   Eye Problem    Erik Chung is a 53 y.o. male. With past medical history of HTN, DM, CHF with complaint of right eye swelling, pain, erythema and drainage of right eye. He dose wear contacts, but has not been wearing them because of this eye irritation, think has been ongoing for about 2-1/2 or 3 weeks.  He has not noted any obvious vision change.  Describes discomfort with eye range of motion but does not feel that it is necessarily worse when looking side-to-side.  Has not noticed any fever or chills.  Prior to this starting had an eye exam done about a month ago.    Eye Problem      Home Medications Prior to Admission medications   Medication Sig Start Date End Date Taking? Authorizing Provider  dapagliflozin  propanediol (FARXIGA ) 10 MG TABS tablet Take 1 tablet (10 mg total) by mouth daily. 03/17/24   Ozell Blunt, MD  furosemide  (LASIX ) 20 MG tablet Take 1 tablet (20 mg total) by mouth daily as needed. 03/16/24   Ozell Blunt, MD  hydrALAZINE  (APRESOLINE ) 50 MG tablet Take 1 tablet (50 mg total) by mouth every 8 (eight) hours. 03/16/24   Ozell Blunt, MD  insulin  aspart protamine - aspart (NOVOLOG  70/30 MIX) (70-30) 100 UNIT/ML FlexPen Inject 15 Units into the skin 2 (two) times daily. 03/16/24   Ozell Blunt, MD  Insulin  Pen Needle 32G X 4 MM MISC Use with Insulin  pen 03/16/24   Ozell Blunt, MD  losartan  (COZAAR ) 25 MG tablet Take 1 tablet (25 mg total) by mouth daily. 03/17/24   Ozell Blunt, MD      Allergies    Shellfish allergy and Penicillins    Review of Systems   Review of Systems  HENT:  Positive for ear discharge and ear pain.     Physical Exam Updated Vital Signs BP (!) 184/94 (BP Location: Left Arm)   Pulse 95   Temp 98.8 F (37.1 C) (Oral)   Resp 16   Ht 5\' 10"   (1.778 m)   Wt 99.8 kg   SpO2 95%   BMI 31.57 kg/m  Physical Exam Vitals and nursing note reviewed.  Constitutional:      General: He is not in acute distress.    Appearance: He is not toxic-appearing.  HENT:     Head: Normocephalic and atraumatic.  Eyes:     General: No scleral icterus.    Intraocular pressure: Right eye pressure is 17 mmHg. Measurements were taken using a handheld tonometer.    Extraocular Movements: Extraocular movements intact.     Conjunctiva/sclera: Conjunctivae normal.      Comments: Right eye upper and lower eyelid swelling, no erythema. Clear drainage present. No obvious proptosis on exam.  Small corneal abrasion to right eye.   Cardiovascular:     Rate and Rhythm: Normal rate and regular rhythm.     Pulses: Normal pulses.     Heart sounds: Normal heart sounds.  Pulmonary:     Effort: Pulmonary effort is normal. No respiratory distress.     Breath sounds: Normal breath sounds.  Abdominal:     General: Abdomen is flat. Bowel sounds are normal.     Palpations: Abdomen is soft.     Tenderness: There  is no abdominal tenderness.  Musculoskeletal:     Right lower leg: No edema.     Left lower leg: No edema.  Skin:    General: Skin is warm and dry.     Findings: No lesion.  Neurological:     General: No focal deficit present.     Mental Status: He is alert and oriented to person, place, and time. Mental status is at baseline.        ED Results / Procedures / Treatments   Labs (all labs ordered are listed, but only abnormal results are displayed) Labs Reviewed  CBC WITH DIFFERENTIAL/PLATELET - Abnormal; Notable for the following components:      Result Value   MCV 78.7 (*)    MCH 24.0 (*)    RDW 16.4 (*)    All other components within normal limits  BASIC METABOLIC PANEL WITH GFR    EKG None  Radiology No results found.  Procedures Procedures    Medications Ordered in ED Medications - No data to display  ED Course/ Medical  Decision Making/ A&P                                 Medical Decision Making Amount and/or Complexity of Data Reviewed Labs: ordered. Radiology: ordered.  Risk Prescription drug management.   This patient presents to the ED for concern of eye problem, this involves an extensive number of treatment options, and is a complaint that carries with it a high risk of complications and morbidity.  The differential diagnosis includes glaucoma, orbital abscess, viral conjunctivitis, bacterial conjunctivitis, corneal abrasion   Co morbidities that complicate the patient evaluation  DM2   Lab Tests:  I personally interpreted labs.  The pertinent results include:  CBC, BMP   Imaging Studies ordered:  I ordered imaging studies including CT of orbits with contrast    Cardiac Monitoring: / EKG:  The patient was maintained on a cardiac monitor.     Problem List / ED Course / Critical interventions / Medication management  Patient reporting to emergency room with right eye pain.  Describes this as an uncomfortable aching sensation that is mild.  This started approximately 3 weeks ago and is associated with drainage.  He has a history of wearing contacts.  He denies any direct trauma to the area.  Has had similar symptoms in the past. Eye exam with normal IOP, increased uptake of dye to right corneal in lower right quadrant. Given swelling of eye lid obtaining labs and imaging.  I ordered medication including tetracaine  and fluorescein  stain. Reevaluation of the patient after these medicines showed that the patient stayed the same I have reviewed the patients home medicines and have made adjustments as needed   Plan  Signed off to [facility] PA-C at shift change pending labs and imaging.          Final Clinical Impression(s) / ED Diagnoses Final diagnoses:  None    Rx / DC Orders ED Discharge Orders     None         Eudora Heron, PA-C 04/17/24 1315    Mordecai Applebaum, MD 04/18/24 [phone]

## 2024-04-17 NOTE — ED Provider Notes (Cosign Needed Addendum)
 Patient signed out to myself by Alveda Jing, PA-C pending CT orbits with contrast which will determine disposition.  Please refer to their note for full HPI, ROS, PE, and MDM.   Patient with a past medical history significant for diabetes, hypertension, and CHF presents today for approximately 3 weeks of right eye swelling, pain, erythema, and drainage.  Patient does normally wear contacts however has stopped wearing them due to this irritation.  Patient denies any fever, chills, vision changes, or pain with EOM.  Patient noted to have small corneal abrasion to right eye on Woods lamp exam. Physical Exam  BP (!) 184/94 (BP Location: Left Arm)   Pulse 95   Temp 98.8 F (37.1 C) (Oral)   Resp 16   Ht 5\' 10"  (1.778 m)   Wt 99.8 kg   SpO2 95%   BMI 31.57 kg/m   Physical Exam Constitutional:      Appearance: Normal appearance.  HENT:     Head: Normocephalic and atraumatic.     Right Ear: External ear normal.     Left Ear: External ear normal.     Nose: Nose normal.     Mouth/Throat:     Mouth: Mucous membranes are moist.  Eyes:     Extraocular Movements: Extraocular movements intact.     Pupils: Pupils are equal, round, and reactive to light.  Cardiovascular:     Rate and Rhythm: Normal rate.  Pulmonary:     Effort: Pulmonary effort is normal.  Musculoskeletal:     Cervical back: Neck supple.  Skin:    General: Skin is warm and dry.     Capillary Refill: Capillary refill takes less than 2 seconds.  Neurological:     Mental Status: He is alert and oriented to person, place, and time.     Procedures  Procedures  ED Course / MDM    Medical Decision Making Amount and/or Complexity of Data Reviewed Labs: ordered. Radiology: ordered.  Risk Prescription drug management.   Lab Tests:  I Ordered, and personally interpreted labs.  The pertinent results include: Elevated glucose at 272   Imaging Studies ordered:  I ordered imaging studies including CT orbits with  contrast I independently visualized and interpreted imaging which showed subtle right periorbital edema consistent with provided history of periorbital cellulitis.  No CT evidence of postseptal orbital extension of infection I agree with the radiologist interpretation  Consider for mission further workup however patient's vital signs, physical exam, labs, and imaging were reassuring.  Patient's symptoms likely due to corneal abrasion and periorbital cellulitis.  Patient prescribed Polymyxin ophthalmic ointment for corneal abrasion and Cefdinir  and clindamycin  for periorbital cellulitis.  Patient to return to ED if he has uncontrolled fever, vomiting, or if his symptoms worsen.        Carie Charity, PA-C 04/17/24 1638    Carie Charity, PA-C 04/17/24 1659    Mordecai Applebaum, MD 04/18/24 [phone]

## 2024-05-02 ENCOUNTER — Ambulatory Visit: Payer: Self-pay | Admitting: Nurse Practitioner

## 2024-07-19 ENCOUNTER — Ambulatory Visit: Payer: Self-pay | Admitting: Nurse Practitioner
